# Patient Record
Sex: Female | Born: 1970 | Race: White | Hispanic: No | Marital: Married | Smoking: Former smoker
Health system: Southern US, Community
[De-identification: ages and names within clinical notes are randomized; demographics above are authoritative.]

## PROBLEM LIST (undated history)

## (undated) DIAGNOSIS — M1990 Unspecified osteoarthritis, unspecified site: Secondary | ICD-10-CM

## (undated) HISTORY — PX: DILITATION & CURRETTAGE/HYSTROSCOPY WITH ESSURE: SHX5573

## (undated) HISTORY — PX: OVARIAN CYST REMOVAL: SHX89

## (undated) HISTORY — DX: Unspecified osteoarthritis, unspecified site: M19.90

## (undated) HISTORY — PX: BREAST CYST ASPIRATION: SHX578

## (undated) HISTORY — PX: REFRACTIVE SURGERY: SHX103

## (undated) HISTORY — PX: ECTOPIC PREGNANCY SURGERY: SHX613

---

## 2003-02-13 ENCOUNTER — Ambulatory Visit (HOSPITAL_COMMUNITY): Admission: RE | Admit: 2003-02-13 | Discharge: 2003-02-13 | Payer: Self-pay | Admitting: *Deleted

## 2005-05-16 ENCOUNTER — Inpatient Hospital Stay: Payer: Self-pay | Admitting: Obstetrics and Gynecology

## 2008-04-28 ENCOUNTER — Ambulatory Visit: Payer: Self-pay | Admitting: Obstetrics and Gynecology

## 2009-02-28 ENCOUNTER — Emergency Department: Payer: Self-pay | Admitting: Emergency Medicine

## 2009-11-20 HISTORY — PX: BREAST BIOPSY: SHX20

## 2012-07-16 ENCOUNTER — Ambulatory Visit: Payer: Self-pay | Admitting: Obstetrics and Gynecology

## 2013-07-21 LAB — HM MAMMOGRAPHY: HM MAMMO: NORMAL

## 2013-07-23 ENCOUNTER — Ambulatory Visit: Payer: Self-pay | Admitting: Obstetrics and Gynecology

## 2014-10-01 ENCOUNTER — Ambulatory Visit: Payer: Self-pay | Admitting: Internal Medicine

## 2015-05-27 ENCOUNTER — Encounter: Payer: Self-pay | Admitting: Internal Medicine

## 2015-05-27 DIAGNOSIS — Z9889 Other specified postprocedural states: Secondary | ICD-10-CM | POA: Insufficient documentation

## 2015-05-27 DIAGNOSIS — E010 Iodine-deficiency related diffuse (endemic) goiter: Secondary | ICD-10-CM | POA: Insufficient documentation

## 2015-05-27 DIAGNOSIS — K5909 Other constipation: Secondary | ICD-10-CM | POA: Insufficient documentation

## 2015-05-27 DIAGNOSIS — F411 Generalized anxiety disorder: Secondary | ICD-10-CM | POA: Insufficient documentation

## 2015-05-27 DIAGNOSIS — G4700 Insomnia, unspecified: Secondary | ICD-10-CM | POA: Insufficient documentation

## 2015-05-27 DIAGNOSIS — M5417 Radiculopathy, lumbosacral region: Secondary | ICD-10-CM | POA: Insufficient documentation

## 2015-05-28 ENCOUNTER — Encounter: Payer: Self-pay | Admitting: Internal Medicine

## 2015-05-28 ENCOUNTER — Ambulatory Visit (INDEPENDENT_AMBULATORY_CARE_PROVIDER_SITE_OTHER): Payer: BC Managed Care – PPO | Admitting: Internal Medicine

## 2015-05-28 VITALS — BP 122/76 | HR 100 | Ht 68.0 in | Wt 191.2 lb

## 2015-05-28 DIAGNOSIS — N6320 Unspecified lump in the left breast, unspecified quadrant: Secondary | ICD-10-CM

## 2015-05-28 DIAGNOSIS — N63 Unspecified lump in breast: Secondary | ICD-10-CM | POA: Diagnosis not present

## 2015-05-28 DIAGNOSIS — F410 Panic disorder [episodic paroxysmal anxiety] without agoraphobia: Secondary | ICD-10-CM | POA: Insufficient documentation

## 2015-05-28 NOTE — Progress Notes (Signed)
Date:  05/28/2015   Name:  Lauren Cooke   DOB:  04/21/1971   MRN:  [id number]   Chief Complaint: Breast Mass HPI - Noticed a lump in the other left breast about one week ago.  She has had several breast cysts in the past that have had FNA.  All have been benign.  There is no family hx of breast cancer but her mother has fibrocystic breast disease.  She denies redness, breast pain or nipple discharge.  She has been consuming caffeine on a regular basis.  Her last menses was 2 weeks ago.   Review of Systems:  Review of Systems  Constitutional: Negative for fever.  Respiratory: Negative for shortness of breath.   Cardiovascular: Negative for chest pain.  Skin: Negative for color change and rash.  Hematological: Negative for adenopathy. Does not bruise/bleed easily.    Patient Active Problem List   Diagnosis Date Noted  . Episodic paroxysmal anxiety disorder 05/28/2015  . Chronic constipation 05/27/2015  . Anxiety, generalized 05/27/2015  . History of biliary T-tube placement 05/27/2015  . 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 L-S radiculopathy 05/27/2015  . Big thyroid 05/27/2015    Prior to Admission medications   Medication Sig Start Date End Date Taking? Authorizing Provider  PARoxetine (PAXIL-CR) 25 MG 24 hr tablet Take 2 tablets by mouth daily at 2 PM daily at 2 PM.   Yes Historical Provider, MD  zolpidem (AMBIEN) 10 MG tablet Take 1 tablet by mouth at bedtime.   Yes Historical Provider, MD    Allergies  Allergen Reactions  . Clindamycin Other (See Comments) and Anaphylaxis  . Cefuroxime Swelling  . Erythromycin Rash  . Penicillins Rash    Past Surgical History  Procedure Laterality Date  . Breast biopsy  2011    benign  . Dilitation & currettage/hystroscopy with essure    . Refractive surgery    . Ectopic pregnancy surgery    . Ovarian cyst removal      History  Substance Use Topics  . Smoking status: Former Smoker    Quit date:  11/20/1996  . Smokeless tobacco: Not on file  . Alcohol Use: 0.0 oz/week    0 Standard drinks or equivalent per week     Medication list has been reviewed and updated.  Physical Examination:  Physical Exam  Constitutional: She appears well-developed and well-nourished. No distress.  Cardiovascular: Normal rate and regular rhythm.   Pulmonary/Chest: Breath sounds normal. Right breast exhibits no mass, no nipple discharge, no skin change and no tenderness. Left breast exhibits mass. Left breast exhibits no nipple discharge, no skin change and no tenderness.    Lymphadenopathy:    She has no cervical adenopathy.    She has no axillary adenopathy.  Skin: Skin is warm, dry and intact.    BP 122/76 mmHg  Pulse 100  Ht 5' 8"  (1.727 m)  Wt 191 lb 3.2 oz (86.728 kg)  BMI 29.08 kg/m2  Assessment and Plan: 1. Breast mass, left Refer for evaluation Reduce caffeine intake - MM Digital Diagnostic Unilat L; Future - US BREAST COMPLETE UNI LEFT INC AXILLA; Future   Halina Maidens, MD [facility]  05/28/2015

## 2015-06-02 ENCOUNTER — Ambulatory Visit
Admission: RE | Admit: 2015-06-02 | Discharge: 2015-06-02 | Disposition: A | Discharge status: Home / Self Care | Payer: BC Managed Care – PPO | Source: Ambulatory Visit | Attending: Internal Medicine | Admitting: Internal Medicine

## 2015-06-02 ENCOUNTER — Other Ambulatory Visit: Payer: Self-pay | Admitting: Internal Medicine

## 2015-06-02 DIAGNOSIS — N6320 Unspecified lump in the left breast, unspecified quadrant: Secondary | ICD-10-CM

## 2015-06-02 DIAGNOSIS — N63 Unspecified lump in breast: Secondary | ICD-10-CM | POA: Insufficient documentation

## 2015-12-13 ENCOUNTER — Other Ambulatory Visit: Payer: Self-pay | Admitting: Internal Medicine

## 2015-12-13 DIAGNOSIS — Z1231 Encounter for screening mammogram for malignant neoplasm of breast: Secondary | ICD-10-CM

## 2015-12-15 ENCOUNTER — Ambulatory Visit
Admission: RE | Admit: 2015-12-15 | Discharge: 2015-12-15 | Disposition: A | Discharge status: Home / Self Care | Payer: BC Managed Care – PPO | Source: Ambulatory Visit | Attending: Internal Medicine | Admitting: Internal Medicine

## 2015-12-15 DIAGNOSIS — Z1231 Encounter for screening mammogram for malignant neoplasm of breast: Secondary | ICD-10-CM | POA: Diagnosis not present

## 2016-02-21 IMAGING — US US BREAST LTD UNI LEFT INC AXILLA
1 series · 1 of 1 positions shown · non-contrast
Comparison: Previous exam(s).

CLINICAL DATA: Patient for evaluation of left breast palpable
abnormality.

EXAM:
DIGITAL DIAGNOSTIC LEFT MAMMOGRAM WITH CAD
ULTRASOUND LEFT BREAST

[Series 1: us breast ltd uni left inc axilla · 0.08mm/px · 1 of 1 slices shown]
[im 1/1]
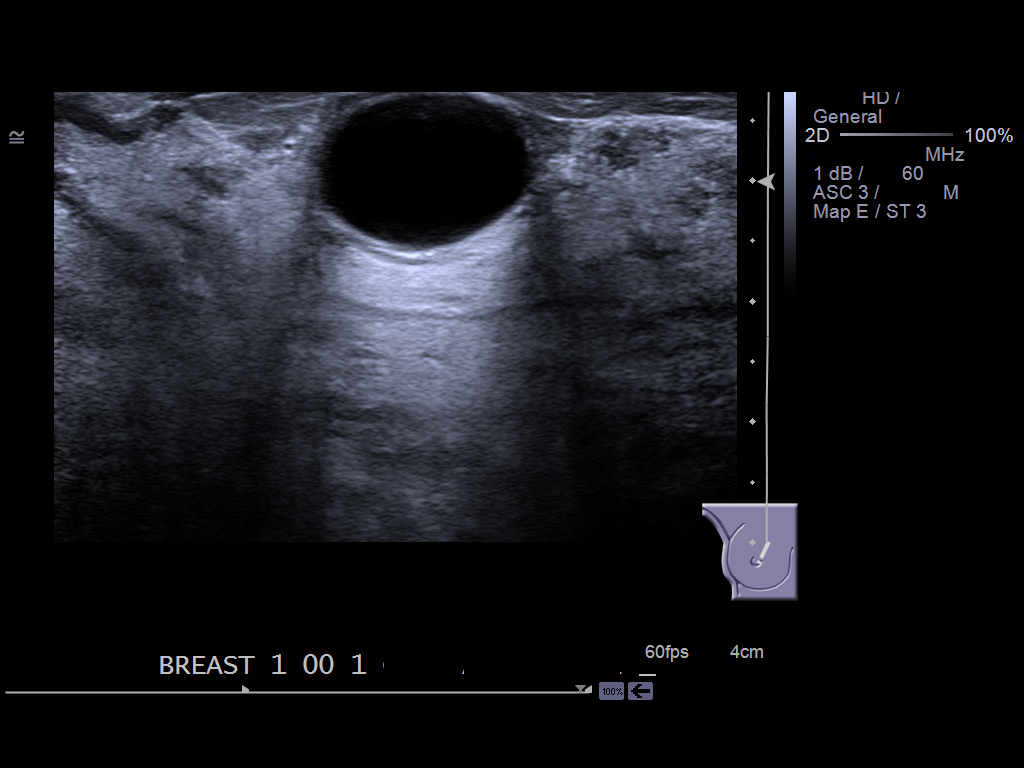

[1 of 1 positions shown; findings below may reference images not displayed]

ACR Breast Density Category d: The breast tissue is extremely dense,
which lowers the sensitivity of mammography.
FINDINGS: Multiple oval circumscribed masses are demonstrated throughout the
left breast. Underlying the palpable abnormality within the
upper-outer left breast anterior depth is an obscured oval mass. No
suspicious areas of architectural distortion identified.

Mammographic images were processed with CAD.

On physical exam, I palpate a soft mobile mass within the
upper-outer left breast.

Targeted ultrasound is performed, showing a 2.0 x 1.4 x 1.8 cm
simple cyst within the left breast 1 o'clock position 1 cm from the
nipple. Multiple adjacent simple cysts are demonstrated.
IMPRESSION: Palpable abnormality corresponds with a benign simple cyst.

RECOMMENDATION:
Return to annual screening mammography [DATE].

I have discussed the findings and recommendations with the patient.
Results were also provided in writing at the conclusion of the
visit. If applicable, a reminder letter will be sent to the patient
regarding the next appointment.

BI-RADS CATEGORY  2: Benign.

## 2016-02-24 ENCOUNTER — Ambulatory Visit (INDEPENDENT_AMBULATORY_CARE_PROVIDER_SITE_OTHER): Payer: BC Managed Care – PPO | Admitting: Internal Medicine

## 2016-02-24 ENCOUNTER — Other Ambulatory Visit: Payer: Self-pay | Admitting: Internal Medicine

## 2016-02-24 ENCOUNTER — Encounter: Payer: Self-pay | Admitting: Internal Medicine

## 2016-02-24 VITALS — BP 112/70 | HR 68 | Ht 68.0 in | Wt 197.2 lb

## 2016-02-24 DIAGNOSIS — F411 Generalized anxiety disorder: Secondary | ICD-10-CM

## 2016-02-24 DIAGNOSIS — G4700 Insomnia, unspecified: Secondary | ICD-10-CM

## 2016-02-24 DIAGNOSIS — M5417 Radiculopathy, lumbosacral region: Secondary | ICD-10-CM | POA: Diagnosis not present

## 2016-02-24 MED ORDER — CYCLOBENZAPRINE HCL 10 MG PO TABS: 10.0000 mg | ORAL_TABLET | Freq: Three times a day (TID) | ORAL | Status: DC | PRN

## 2016-02-24 NOTE — Patient Instructions (Signed)
Breast Self-Awareness Practicing breast self-awareness may pick up problems early, prevent significant medical complications, and possibly save your life. By practicing breast self-awareness, you can become familiar with how your breasts look and feel and if your breasts are changing. This allows you to notice changes early. It can also offer you some reassurance that your breast health is good. One way to learn what is normal for your breasts and whether your breasts are changing is to do a breast self-exam. If you find a lump or something that was not present in the past, it is best to contact your caregiver right away. Other findings that should be evaluated by your caregiver include nipple discharge, especially if it is bloody; skin changes or reddening; areas where the skin seems to be pulled in (retracted); or new lumps and bumps. Breast pain is seldom associated with cancer (malignancy), but should also be evaluated by a caregiver. HOW TO PERFORM A BREAST SELF-EXAM The best time to examine your breasts is 5-7 days after your menstrual period is over. During menstruation, the breasts are lumpier, and it may be more difficult to pick up changes. If you do not menstruate, have reached menopause, or had your uterus removed (hysterectomy), you should examine your breasts at regular intervals, such as monthly. If you are breastfeeding, examine your breasts after a feeding or after using a breast pump. Breast implants do not decrease the risk for lumps or tumors, so continue to perform breast self-exams as recommended. Talk to your caregiver about how to determine the difference between the implant and breast tissue. Also, talk about the amount of pressure you should use during the exam. Over time, you will become more familiar with the variations of your breasts and more comfortable with the exam. A breast self-exam requires you to remove all your clothes above the waist. 1. Look at your breasts and nipples.  Stand in front of a mirror in a room with good lighting. With your hands on your hips, push your hands firmly downward. Look for a difference in shape, contour, and size from one breast to the other (asymmetry). Asymmetry includes puckers, dips, or bumps. Also, look for skin changes, such as reddened or scaly areas on the breasts. Look for nipple changes, such as discharge, dimpling, repositioning, or redness. 2. Carefully feel your breasts. This is best done either in the shower or tub while using soapy water or when flat on your back. Place the arm (on the side of the breast you are examining) above your head. Use the pads (not the fingertips) of your three middle fingers on your opposite hand to feel your breasts. Start in the underarm area and use  inch (2 cm) overlapping circles to feel your breast. Use 3 different levels of pressure (light, medium, and firm pressure) at each circle before moving to the next circle. The light pressure is needed to feel the tissue closest to the skin. The medium pressure will help to feel breast tissue a little deeper, while the firm pressure is needed to feel the tissue close to the ribs. Continue the overlapping circles, moving downward over the breast until you feel your ribs below your breast. Then, move one finger-width towards the center of the body. Continue to use the  inch (2 cm) overlapping circles to feel your breast as you move slowly up toward the collar bone (clavicle) near the base of the neck. Continue the up and down exam using all 3 pressures until you reach the  middle of the chest. Do this with each breast, carefully feeling for lumps or changes. 3.  Keep a written record with breast changes or normal findings for each breast. By writing this information down, you do not need to depend only on memory for size, tenderness, or location. Write down where you are in your menstrual cycle, if you are still menstruating. Breast tissue can have some lumps or  thick tissue. However, see your caregiver if you find anything that concerns you.  SEEK MEDICAL CARE IF:  You see a change in shape, contour, or size of your breasts or nipples.   You see skin changes, such as reddened or scaly areas on the breasts or nipples.   You have an unusual discharge from your nipples.   You feel a new lump or unusually thick areas.    This information is not intended to replace advice given to you by your health care provider. Make sure you discuss any questions you have with your health care provider.   Document Released: 11/06/2005 Document Revised: 10/23/2012 Document Reviewed: 02/21/2012 Elsevier Interactive Patient Education [employer].

## 2016-02-24 NOTE — Progress Notes (Signed)
Date:  02/24/2016   Name:  Lauren Cooke   DOB:  [birth year]   MRN:  [id number]   Chief Complaint: Back Pain Back Pain This is a recurrent problem. The current episode started more than 1 year ago. The problem occurs every several days. The pain is present in the lumbar spine. The quality of the pain is described as aching and cramping. The pain is mild. Pertinent negatives include no chest pain, numbness or weakness. She has tried muscle relaxant and NSAIDs for the symptoms.  She has been evaluated about 2 years ago by Dr. Velna Ochs in [facility].  Her MRI showed small disc bulge at 2 levels in the lumbar spine. She gets back spasm and discomfort after excessive activity such as yard work.  She would like to take flexeril 1-2 times per week.  She is running out of medication.   Review of Systems  Constitutional: Negative for chills and fatigue.  Respiratory: Negative for choking and shortness of breath.   Cardiovascular: Negative for chest pain, palpitations and leg swelling.  Gastrointestinal:       No loss of bowel control  Genitourinary: Negative for difficulty urinating.  Musculoskeletal: Positive for myalgias and back pain. Negative for joint swelling and gait problem.  Neurological: Negative for weakness and numbness.    Patient Active Problem List   Diagnosis Date Noted  . Episodic paroxysmal anxiety disorder 05/28/2015  . Chronic constipation 05/27/2015  . Anxiety, generalized 05/27/2015  . History of biliary T-tube placement 05/27/2015  . 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 L-S radiculopathy 05/27/2015  . Big thyroid 05/27/2015    Prior to Admission medications   Medication Sig Start Date End Date Taking? Authorizing Provider  sertraline (ZOLOFT) 50 MG tablet Take 1 tablet by mouth daily. 02/15/16  Yes Historical Provider, MD  zolpidem (AMBIEN) 10 MG tablet Take 1 tablet by mouth at bedtime.   Yes Historical Provider, MD    Allergies  Allergen  Reactions  . Clindamycin Other (See Comments) and Anaphylaxis  . Cefuroxime Swelling  . Erythromycin Rash  . Penicillins Rash    Past Surgical History  Procedure Laterality Date  . Breast biopsy  2011    benign  . Dilitation & currettage/hystroscopy with essure    . Refractive surgery    . Ectopic pregnancy surgery    . Ovarian cyst removal    . Breast cyst aspiration Right     negative 2011    Social History  Substance Use Topics  . Smoking status: Former Smoker    Quit date: 11/20/1996  . Smokeless tobacco: None  . Alcohol Use: 0.0 oz/week    0 Standard drinks or equivalent per week     Comment: occasional     Medication list has been reviewed and updated.   Physical Exam  Constitutional: She appears well-developed and well-nourished.  Neck: Normal range of motion.  Cardiovascular: Normal rate, regular rhythm and normal heart sounds.   Pulmonary/Chest: Effort normal and breath sounds normal.  Musculoskeletal:       Right hip: Normal. She exhibits normal range of motion.       Left hip: Normal.       Lumbar back: She exhibits tenderness and spasm. She exhibits no edema and no pain.  Normal SLR, normal reflexes  Neurological: She has normal reflexes.  Nursing note and vitals reviewed.   BP 112/70 mmHg  Pulse 68  Ht 5' 8"  (1.727 m)  Wt 197  lb 3.2 oz (89.449 kg)  BMI 29.99 kg/m2  LMP 02/22/2016  Assessment and Plan: 1. L-S radiculopathy Use flexeril PRN along with Aleve - cyclobenzaprine (FLEXERIL) 10 MG tablet; Take 1 tablet (10 mg total) by mouth 3 (three) times daily as needed for muscle spasms.  Dispense: 60 tablet; Refill: 1  2. Insomnia disorder related to known organic factor stable  3. Anxiety, generalized Doing well on Zoloft - followed by Dr. Melba Coon, MD [facility]  02/24/2016

## 2016-03-09 ENCOUNTER — Ambulatory Visit (INDEPENDENT_AMBULATORY_CARE_PROVIDER_SITE_OTHER): Payer: BC Managed Care – PPO | Admitting: Internal Medicine

## 2016-03-09 ENCOUNTER — Encounter: Payer: Self-pay | Admitting: Internal Medicine

## 2016-03-09 VITALS — BP 108/76 | HR 100 | Ht 68.0 in | Wt 195.0 lb

## 2016-03-09 DIAGNOSIS — E049 Nontoxic goiter, unspecified: Secondary | ICD-10-CM

## 2016-03-09 DIAGNOSIS — F411 Generalized anxiety disorder: Secondary | ICD-10-CM | POA: Diagnosis not present

## 2016-03-09 DIAGNOSIS — Z0000 Encounter for general adult medical examination without abnormal findings: Secondary | ICD-10-CM

## 2016-03-09 DIAGNOSIS — Z124 Encounter for screening for malignant neoplasm of cervix: Secondary | ICD-10-CM

## 2016-03-09 LAB — POCT URINALYSIS DIPSTICK
Bilirubin, UA: NEGATIVE
Blood, UA: NEGATIVE
Glucose, UA: NEGATIVE
KETONES UA: NEGATIVE
Leukocytes, UA: NEGATIVE
Nitrite, UA: NEGATIVE
Protein, UA: NEGATIVE
SPEC GRAV UA: 1.015
Urobilinogen, UA: 0.2
pH, UA: 5

## 2016-03-09 NOTE — Patient Instructions (Signed)
Breast Self-Awareness Practicing breast self-awareness may pick up problems early, prevent significant medical complications, and possibly save your life. By practicing breast self-awareness, you can become familiar with how your breasts look and feel and if your breasts are changing. This allows you to notice changes early. It can also offer you some reassurance that your breast health is good. One way to learn what is normal for your breasts and whether your breasts are changing is to do a breast self-exam. If you find a lump or something that was not present in the past, it is best to contact your caregiver right away. Other findings that should be evaluated by your caregiver include nipple discharge, especially if it is bloody; skin changes or reddening; areas where the skin seems to be pulled in (retracted); or new lumps and bumps. Breast pain is seldom associated with cancer (malignancy), but should also be evaluated by a caregiver. HOW TO PERFORM A BREAST SELF-EXAM The best time to examine your breasts is 5-7 days after your menstrual period is over. During menstruation, the breasts are lumpier, and it may be more difficult to pick up changes. If you do not menstruate, have reached menopause, or had your uterus removed (hysterectomy), you should examine your breasts at regular intervals, such as monthly. If you are breastfeeding, examine your breasts after a feeding or after using a breast pump. Breast implants do not decrease the risk for lumps or tumors, so continue to perform breast self-exams as recommended. Talk to your caregiver about how to determine the difference between the implant and breast tissue. Also, talk about the amount of pressure you should use during the exam. Over time, you will become more familiar with the variations of your breasts and more comfortable with the exam. A breast self-exam requires you to remove all your clothes above the waist. 1. Look at your breasts and nipples.  Stand in front of a mirror in a room with good lighting. With your hands on your hips, push your hands firmly downward. Look for a difference in shape, contour, and size from one breast to the other (asymmetry). Asymmetry includes puckers, dips, or bumps. Also, look for skin changes, such as reddened or scaly areas on the breasts. Look for nipple changes, such as discharge, dimpling, repositioning, or redness. 2. Carefully feel your breasts. This is best done either in the shower or tub while using soapy water or when flat on your back. Place the arm (on the side of the breast you are examining) above your head. Use the pads (not the fingertips) of your three middle fingers on your opposite hand to feel your breasts. Start in the underarm area and use  inch (2 cm) overlapping circles to feel your breast. Use 3 different levels of pressure (light, medium, and firm pressure) at each circle before moving to the next circle. The light pressure is needed to feel the tissue closest to the skin. The medium pressure will help to feel breast tissue a little deeper, while the firm pressure is needed to feel the tissue close to the ribs. Continue the overlapping circles, moving downward over the breast until you feel your ribs below your breast. Then, move one finger-width towards the center of the body. Continue to use the  inch (2 cm) overlapping circles to feel your breast as you move slowly up toward the collar bone (clavicle) near the base of the neck. Continue the up and down exam using all 3 pressures until you reach the  middle of the chest. Do this with each breast, carefully feeling for lumps or changes. 3.  Keep a written record with breast changes or normal findings for each breast. By writing this information down, you do not need to depend only on memory for size, tenderness, or location. Write down where you are in your menstrual cycle, if you are still menstruating. Breast tissue can have some lumps or  thick tissue. However, see your caregiver if you find anything that concerns you.  SEEK MEDICAL CARE IF:  You see a change in shape, contour, or size of your breasts or nipples.   You see skin changes, such as reddened or scaly areas on the breasts or nipples.   You have an unusual discharge from your nipples.   You feel a new lump or unusually thick areas.    This information is not intended to replace advice given to you by your health care provider. Make sure you discuss any questions you have with your health care provider.   Document Released: 11/06/2005 Document Revised: 10/23/2012 Document Reviewed: 02/21/2012 Elsevier Interactive Patient Education [employer].

## 2016-03-09 NOTE — Progress Notes (Signed)
Date:  03/09/2016   Name:  Lauren Cooke   DOB:  [birth year]   MRN:  [id number]   Chief Complaint: Annual Exam Lauren Cooke is a 45 y.o. female who presents today for her Complete Annual Exam. She feels fairly well. She reports exercising intermittently. She reports she is sleeping fairly well. Mammogram was done in January.  Insomnia Primary symptoms: no sleep disturbance.  The problem occurs every several days. The problem is unchanged. The symptoms are aggravated by anxiety. Past treatments include medication. The treatment provided significant relief. Prior workup: followed by Psychiatry - treated for anxiety and insomnia.     Review of Systems  Constitutional: Negative for fever, chills and fatigue.  HENT: Negative for congestion, hearing loss, tinnitus, trouble swallowing and voice change.   Eyes: Negative for visual disturbance.  Respiratory: Negative for cough, chest tightness, shortness of breath and wheezing.   Cardiovascular: Negative for chest pain, palpitations and leg swelling.  Gastrointestinal: Negative for vomiting, abdominal pain, diarrhea and constipation.  Endocrine: Negative for polydipsia and polyuria.  Genitourinary: Negative for dysuria, frequency, vaginal bleeding, vaginal discharge and genital sores.  Musculoskeletal: Positive for myalgias and back pain. Negative for joint swelling, arthralgias and gait problem.  Skin: Negative for color change and rash.  Neurological: Negative for dizziness, tremors, light-headedness and headaches.  Hematological: Negative for adenopathy. Does not bruise/bleed easily.  Psychiatric/Behavioral: Negative for sleep disturbance and dysphoric mood. The patient has insomnia. The patient is not nervous/anxious.     Patient Active Problem List   Diagnosis Date Noted  . Enlarged thyroid gland 03/09/2016  . Episodic paroxysmal anxiety disorder 05/28/2015  . Chronic constipation 05/27/2015  . Anxiety, generalized 05/27/2015    . 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 L-S radiculopathy 05/27/2015    Prior to Admission medications   Medication Sig Start Date End Date Taking? Authorizing Provider  cyclobenzaprine (FLEXERIL) 10 MG tablet Take 1 tablet (10 mg total) by mouth 3 (three) times daily as needed for muscle spasms. 02/24/16   Glean Hess, MD  sertraline (ZOLOFT) 50 MG tablet Take 1 tablet by mouth daily. 02/15/16   Historical Provider, MD  zolpidem (AMBIEN) 10 MG tablet Take 1 tablet by mouth at bedtime.    Historical Provider, MD    Allergies  Allergen Reactions  . Clindamycin Other (See Comments) and Anaphylaxis  . Cefuroxime Swelling  . Erythromycin Rash  . Penicillins Rash    Past Surgical History  Procedure Laterality Date  . Breast biopsy  2011    benign  . Dilitation & currettage/hystroscopy with essure    . Refractive surgery    . Ectopic pregnancy surgery    . Ovarian cyst removal    . Breast cyst aspiration Right     negative 2011    Social History  Substance Use Topics  . Smoking status: Former Smoker    Quit date: 11/20/1996  . Smokeless tobacco: None  . Alcohol Use: 0.0 oz/week    0 Standard drinks or equivalent per week     Comment: occasional     Medication list has been reviewed and updated.   Physical Exam  Constitutional: She is oriented to person, place, and time. She appears well-developed and well-nourished. No distress.  HENT:  Head: Normocephalic and atraumatic.  Right Ear: Tympanic membrane and ear canal normal.  Left Ear: Tympanic membrane and ear canal normal.  Nose: Right sinus exhibits no maxillary sinus tenderness. Left sinus exhibits no  maxillary sinus tenderness.  Mouth/Throat: Uvula is midline and oropharynx is clear and moist.  Eyes: Conjunctivae and EOM are normal. Right eye exhibits no discharge. Left eye exhibits no discharge. No scleral icterus.  Neck: Normal range of motion. Carotid bruit is not present. No  erythema present. Thyromegaly (modestly enlarged bilaterally - non tender) present.  Cardiovascular: Normal rate, regular rhythm, normal heart sounds and normal pulses.   Pulmonary/Chest: Effort normal. No respiratory distress. She has no wheezes. Right breast exhibits no mass, no nipple discharge, no skin change and no tenderness. Left breast exhibits no mass, no nipple discharge, no skin change and no tenderness.  Abdominal: Soft. Bowel sounds are normal. There is no hepatosplenomegaly. There is no tenderness. There is no CVA tenderness.  Genitourinary: Vagina normal and uterus normal. There is no rash, tenderness or lesion on the right labia. There is no rash, tenderness or lesion on the left labia. Cervix exhibits no motion tenderness, no discharge and no friability. Right adnexum displays no mass, no tenderness and no fullness. Left adnexum displays no tenderness and no fullness.  Musculoskeletal: Normal range of motion.  Lymphadenopathy:    She has no cervical adenopathy.    She has no axillary adenopathy.  Neurological: She is alert and oriented to person, place, and time. She has normal reflexes. No cranial nerve deficit or sensory deficit.  Skin: Skin is warm, dry and intact. No rash noted.  Psychiatric: She has a normal mood and affect. Her speech is normal and behavior is normal. Thought content normal.  Nursing note and vitals reviewed.   BP 108/76 mmHg  Pulse 100  Ht 5' 8"  (1.727 m)  Wt 195 lb (88.451 kg)  BMI 29.66 kg/m2  LMP 02/22/2016  Assessment and Plan: 1. Annual physical exam Recommend resuming regular exercise - CBC with Differential/Platelet - Comprehensive metabolic panel - Lipid panel - POCT urinalysis dipstick  2. Encounter for screening for cervical cancer  Normal exam - can repeat in 3-5 yrs if Pap/HPV normal - Pap IG and HPV (high risk) DNA detection  3. Anxiety, generalized Followed by Psychiatry  On Zoloft  4. Enlarged thyroid gland Stable without  symptoms - TSH   Lauren Maidens, MD [facility]  03/09/2016

## 2016-03-10 ENCOUNTER — Telehealth: Payer: Self-pay

## 2016-03-10 LAB — COMPREHENSIVE METABOLIC PANEL
ALBUMIN: 4.4 g/dL (ref 3.5–5.5)
ALT: 18 IU/L (ref 0–32)
AST: 13 IU/L (ref 0–40)
Albumin/Globulin Ratio: 1.5 (ref 1.2–2.2)
Alkaline Phosphatase: 87 IU/L (ref 39–117)
BUN / CREAT RATIO: 17 (ref 9–23)
BUN: 13 mg/dL (ref 6–24)
Bilirubin Total: 0.3 mg/dL (ref 0.0–1.2)
CO2: 22 mmol/L (ref 18–29)
Calcium: 9.7 mg/dL (ref 8.7–10.2)
Chloride: 99 mmol/L (ref 96–106)
Creatinine, Ser: 0.77 mg/dL (ref 0.57–1.00)
GFR, EST AFRICAN AMERICAN: 109 mL/min/{1.73_m2} (ref >59)
GFR, EST NON AFRICAN AMERICAN: 94 mL/min/{1.73_m2} (ref >59)
GLOBULIN, TOTAL: 2.9 g/dL (ref 1.5–4.5)
Glucose: 114 mg/dL — ABNORMAL HIGH (ref 65–99)
Potassium: 4.1 mmol/L (ref 3.5–5.2)
Sodium: 139 mmol/L (ref 134–144)
TOTAL PROTEIN: 7.3 g/dL (ref 6.0–8.5)

## 2016-03-10 LAB — CBC WITH DIFFERENTIAL/PLATELET
BASOS ABS: 0 10*3/uL (ref 0.0–0.2)
Basos: 0 %
EOS (ABSOLUTE): 0.1 10*3/uL (ref 0.0–0.4)
EOS: 1 %
HEMATOCRIT: 37.6 % (ref 34.0–46.6)
HEMOGLOBIN: 12.4 g/dL (ref 11.1–15.9)
IMMATURE GRANS (ABS): 0 10*3/uL (ref 0.0–0.1)
Immature Granulocytes: 0 %
LYMPHS: 24 %
Lymphocytes Absolute: 1.8 10*3/uL (ref 0.7–3.1)
MCH: 29 pg (ref 26.6–33.0)
MCHC: 33 g/dL (ref 31.5–35.7)
MCV: 88 fL (ref 79–97)
MONOCYTES: 7 %
Monocytes Absolute: 0.5 10*3/uL (ref 0.1–0.9)
Neutrophils Absolute: 4.9 10*3/uL (ref 1.4–7.0)
Neutrophils: 68 %
Platelets: 280 10*3/uL (ref 150–379)
RBC: 4.27 x10E6/uL (ref 3.77–5.28)
RDW: 13.9 % (ref 12.3–15.4)
WBC: 7.4 10*3/uL (ref 3.4–10.8)

## 2016-03-10 LAB — TSH: TSH: 1.27 u[IU]/mL (ref 0.450–4.500)

## 2016-03-10 LAB — LIPID PANEL
CHOLESTEROL TOTAL: 211 mg/dL — AB (ref 100–199)
Chol/HDL Ratio: 3.2 ratio units (ref 0.0–4.4)
HDL: 65 mg/dL (ref >39)
LDL Calculated: 127 mg/dL — ABNORMAL HIGH (ref 0–99)
Triglycerides: 97 mg/dL (ref 0–149)
VLDL CHOLESTEROL CAL: 19 mg/dL (ref 5–40)

## 2016-03-10 NOTE — Telephone Encounter (Signed)
-----   Message from Glean Hess, MD sent at 03/10/2016  8:28 AM EDT ----- Labs all normal.  Blood sugar slightly elevated since not fasting.

## 2016-03-10 NOTE — Telephone Encounter (Signed)
Spoke with patient. Patient advised of all results and verbalized understanding. Will call back with any future questions or concerns. [facility]

## 2016-03-14 ENCOUNTER — Telehealth: Payer: Self-pay

## 2016-03-14 LAB — PAP IG AND HPV HIGH-RISK
HPV, HIGH-RISK: NEGATIVE
PAP Smear Comment: 0

## 2016-03-14 NOTE — Telephone Encounter (Signed)
Spoke with patient. Patient advised of all results and verbalized understanding. Will call back with any future questions or concerns. [facility]

## 2016-03-14 NOTE — Telephone Encounter (Signed)
-----   Message from Glean Hess, MD sent at 03/14/2016  2:29 PM EDT ----- Pap is negative with negative HPV.

## 2016-05-15 ENCOUNTER — Encounter: Payer: Self-pay | Admitting: Internal Medicine

## 2016-05-15 ENCOUNTER — Ambulatory Visit (INDEPENDENT_AMBULATORY_CARE_PROVIDER_SITE_OTHER): Payer: BC Managed Care – PPO | Admitting: Internal Medicine

## 2016-05-15 VITALS — BP 116/76 | HR 78 | Temp 98.7°F | Resp 16 | Ht 68.0 in | Wt 194.5 lb

## 2016-05-15 DIAGNOSIS — N61 Mastitis without abscess: Secondary | ICD-10-CM | POA: Diagnosis not present

## 2016-05-15 MED ORDER — LEVOFLOXACIN 500 MG PO TABS: 500.0000 mg | ORAL_TABLET | Freq: Every day | ORAL | Status: DC

## 2016-05-15 NOTE — Patient Instructions (Signed)
Continue Tylenol every 4-6 hours - might alternate with Ibuprofen 600 mg (ie Tylenol 2 tabs the ibuprofen 600 mg three hours later, then tylenol 3 hours later, etc.)  Warm or cool compresses if helpful

## 2016-05-15 NOTE — Progress Notes (Signed)
Date:  05/15/2016   Name:  Lauren Cooke   DOB:  11-26-1970   MRN:  [id number]   Chief Complaint: Breast Problem Patient reports started to have tenderness in her left breast 3 days ago. She recalls no injury. Over the next few days has become more tender swollen red and she is running a low-grade fever. She denies any nipple discharge.   Review of Systems  Constitutional: Positive for fever and chills.  Respiratory: Negative for cough, chest tightness and shortness of breath.   Cardiovascular: Negative for chest pain.  Skin: Positive for color change.    Patient Active Problem List   Diagnosis Date Noted  . Enlarged thyroid gland 03/09/2016  . Chronic constipation 05/27/2015  . Anxiety, generalized 05/27/2015  . 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 L-S radiculopathy 05/27/2015    Prior to Admission medications   Medication Sig Start Date End Date Taking? Authorizing Provider  clonazePAM (KLONOPIN) 0.5 MG tablet  05/09/16  Yes Historical Provider, MD  sertraline (ZOLOFT) 100 MG tablet 150 mg.  05/09/16  Yes Historical Provider, MD  zolpidem (AMBIEN) 10 MG tablet Take 1 tablet by mouth at bedtime.   Yes Historical Provider, MD  levofloxacin (LEVAQUIN) 500 MG tablet Take 1 tablet (500 mg total) by mouth daily. 05/15/16   Glean Hess, MD    Allergies  Allergen Reactions  . Clindamycin Other (See Comments) and Anaphylaxis  . Cefuroxime Swelling  . Erythromycin Rash  . Penicillins Rash    Past Surgical History  Procedure Laterality Date  . Breast biopsy  2011    benign  . Dilitation & currettage/hystroscopy with essure    . Refractive surgery    . Ectopic pregnancy surgery    . Ovarian cyst removal    . Breast cyst aspiration Right     negative 2011    Social History  Substance Use Topics  . Smoking status: Former Smoker    Quit date: 11/20/1996  . Smokeless tobacco: None  . Alcohol Use: 0.0 oz/week    0 Standard drinks or  equivalent per week     Comment: occasional     Medication list has been reviewed and updated.   Physical Exam  Constitutional: She is oriented to person, place, and time. She appears well-developed. No distress.  HENT:  Head: Normocephalic and atraumatic.  Cardiovascular: Normal rate and normal heart sounds.   Pulmonary/Chest: Effort normal and breath sounds normal. No respiratory distress.    Musculoskeletal: Normal range of motion.  Lymphadenopathy:    She has no cervical adenopathy.    She has no axillary adenopathy.  Neurological: She is alert and oriented to person, place, and time.  Skin: Skin is warm and dry. No rash noted.  Psychiatric: She has a normal mood and affect. Her behavior is normal. Thought content normal.  Nursing note and vitals reviewed.   BP 116/76 mmHg  Pulse 78  Temp(Src) 98.7 F (37.1 C)  Resp 16  Ht 5' 8"  (1.727 m)  Wt 194 lb 8 oz (88.225 kg)  BMI 29.58 kg/m2  SpO2 98%  LMP 05/03/2016  Assessment and Plan: 1. Mastitis in female Allergy to PCN, Ceph and Erythromycin Will try Levofloxacin and have close follow up Tylenol alt with Ibuprofen - levofloxacin (LEVAQUIN) 500 MG tablet; Take 1 tablet (500 mg total) by mouth daily.  Dispense: 10 tablet; Refill: 0   Halina Maidens, MD [facility]  05/15/2016

## 2016-05-18 ENCOUNTER — Ambulatory Visit (INDEPENDENT_AMBULATORY_CARE_PROVIDER_SITE_OTHER): Payer: BC Managed Care – PPO | Admitting: Internal Medicine

## 2016-05-18 ENCOUNTER — Encounter: Payer: Self-pay | Admitting: Internal Medicine

## 2016-05-18 VITALS — BP 128/78 | HR 97 | Resp 16 | Ht 68.0 in | Wt 199.0 lb

## 2016-05-18 DIAGNOSIS — N61 Mastitis without abscess: Secondary | ICD-10-CM | POA: Diagnosis not present

## 2016-05-18 NOTE — Progress Notes (Signed)
    Date:  05/18/2016   Name:  Lauren Cooke   DOB:  09-06-1971   MRN:  [id number]   Chief Complaint: mastitis Pt here to follow up mastitis.  Seen 3 days ago and placed on antibiotics.  She feels better and is tolerating the antibiotics.  She is having some itching on her nipple but no discharge.  No fevers or chills.    Review of Systems  Constitutional: Negative for fever, chills and fatigue.  Gastrointestinal: Negative for nausea and diarrhea.  Skin: Positive for color change (breast improving). Negative for rash.    Patient Active Problem List   Diagnosis Date Noted  . Enlarged thyroid gland 03/09/2016  . Chronic constipation 05/27/2015  . Anxiety, generalized 05/27/2015  . 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 L-S radiculopathy 05/27/2015    Prior to Admission medications   Medication Sig Start Date End Date Taking? Authorizing Provider  clonazePAM (KLONOPIN) 0.5 MG tablet  05/09/16  Yes Historical Provider, MD  levofloxacin (LEVAQUIN) 500 MG tablet Take 1 tablet (500 mg total) by mouth daily. 05/15/16  Yes Glean Hess, MD  sertraline (ZOLOFT) 100 MG tablet 150 mg.  05/09/16  Yes Historical Provider, MD  zolpidem (AMBIEN) 10 MG tablet Take 1 tablet by mouth at bedtime.   Yes Historical Provider, MD    Allergies  Allergen Reactions  . Clindamycin Other (See Comments) and Anaphylaxis  . Cefuroxime Swelling  . Erythromycin Rash  . Penicillins Rash    Past Surgical History  Procedure Laterality Date  . Breast biopsy  2011    benign  . Dilitation & currettage/hystroscopy with essure    . Refractive surgery    . Ectopic pregnancy surgery    . Ovarian cyst removal    . Breast cyst aspiration Right     negative 2011    Social History  Substance Use Topics  . Smoking status: Former Smoker    Quit date: 11/20/1996  . Smokeless tobacco: None  . Alcohol Use: 0.0 oz/week    0 Standard drinks or equivalent per week     Comment:  occasional     Medication list has been reviewed and updated.   Physical Exam  Constitutional: She is oriented to person, place, and time. She appears well-developed. No distress.  HENT:  Head: Normocephalic and atraumatic.  Pulmonary/Chest: Effort normal. No respiratory distress.    Breast much softer, less tender No nipple discharge  Musculoskeletal: Normal range of motion.  Neurological: She is alert and oriented to person, place, and time.  Skin: Skin is warm and dry. No rash noted.  Psychiatric: She has a normal mood and affect. Her behavior is normal. Thought content normal.  Nursing note and vitals reviewed.   BP 128/78 mmHg  Pulse 97  Resp 16  Ht 5' 8"  (1.727 m)  Wt 199 lb (90.266 kg)  BMI 30.26 kg/m2  SpO2 97%  LMP 05/03/2016  Assessment and Plan: 1. Mastitis in female Improving Finish course of antibiotics Follow up if needed   Halina Maidens, MD [facility]  05/18/2016

## 2016-05-24 ENCOUNTER — Encounter: Payer: BC Managed Care – PPO | Admitting: Internal Medicine

## 2016-06-09 ENCOUNTER — Telehealth: Payer: Self-pay

## 2016-06-09 NOTE — Telephone Encounter (Signed)
Treated for breast infection, took all antibiotic and still has hard knot in left breast, you felt, patient is out of town on vacation next week, wants to know if needs mammogram. Discussed with Dr. Army Melia, stated patient needs appointment to assess location and discuss mammogram. Patient informed and is scheduling for after her vacation.

## 2016-06-27 ENCOUNTER — Ambulatory Visit (INDEPENDENT_AMBULATORY_CARE_PROVIDER_SITE_OTHER): Payer: BC Managed Care – PPO | Admitting: Internal Medicine

## 2016-06-27 ENCOUNTER — Encounter: Payer: Self-pay | Admitting: Internal Medicine

## 2016-06-27 VITALS — BP 112/80 | HR 78 | Resp 16 | Ht 68.0 in | Wt 197.2 lb

## 2016-06-27 DIAGNOSIS — N6325 Unspecified lump in the left breast, overlapping quadrants: Secondary | ICD-10-CM

## 2016-06-27 DIAGNOSIS — N63 Unspecified lump in breast: Secondary | ICD-10-CM

## 2016-06-27 DIAGNOSIS — N6320 Unspecified lump in the left breast, unspecified quadrant: Principal | ICD-10-CM

## 2016-06-27 NOTE — Progress Notes (Signed)
    Date:  06/27/2016   Name:  Lauren Cooke   DOB:  25-Aug-1971   MRN:  [id number]   Chief Complaint: Breast Problem (Had mastitis at the end of June that cleared up with Abx and now has a LUMP on L breast that has moved or went down some. The lump is not painful at all. ) Patient was treated for mastitis of the left breast in June of this year. The infection and pain completely resolved but she has a residual lump that is not improving. She denies skin change or nipple discharge. She feels well. There is no family history of breast cancer.  Review of Systems  All other systems reviewed and are negative.   Patient Active Problem List   Diagnosis Date Noted  . Enlarged thyroid gland 03/09/2016  . Chronic constipation 05/27/2015  . Anxiety, generalized 05/27/2015  . 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 L-S radiculopathy 05/27/2015    Prior to Admission medications   Medication Sig Start Date End Date Taking? Authorizing Provider  clonazePAM (KLONOPIN) 0.5 MG tablet  05/09/16  Yes Historical Provider, MD  sertraline (ZOLOFT) 100 MG tablet 150 mg.  05/09/16  Yes Historical Provider, MD  zolpidem (AMBIEN) 10 MG tablet Take 1 tablet by mouth at bedtime.   Yes Historical Provider, MD  levofloxacin (LEVAQUIN) 500 MG tablet Take 1 tablet (500 mg total) by mouth daily. Patient not taking: Reported on 06/27/2016 05/15/16   Glean Hess, MD    Allergies  Allergen Reactions  . Clindamycin Other (See Comments) and Anaphylaxis  . Cefuroxime Swelling  . Erythromycin Rash  . Penicillins Rash    Past Surgical History:  Procedure Laterality Date  . BREAST BIOPSY  2011   benign  . BREAST CYST ASPIRATION Right    negative 2011  . DILITATION & CURRETTAGE/HYSTROSCOPY WITH ESSURE    . ECTOPIC PREGNANCY SURGERY    . OVARIAN CYST REMOVAL    . REFRACTIVE SURGERY      Social History  Substance Use Topics  . Smoking status: Former Smoker    Quit date: 11/20/1996  .  Smokeless tobacco: Former Systems developer  . Alcohol use 0.0 oz/week     Comment: occasional     Medication list has been reviewed and updated.   Physical Exam  Constitutional: She is oriented to person, place, and time. She appears well-developed. No distress.  HENT:  Head: Normocephalic and atraumatic.  Pulmonary/Chest: Effort normal. No respiratory distress.    Firm non tender mass at 3 oclock  Musculoskeletal: Normal range of motion.  Neurological: She is alert and oriented to person, place, and time.  Skin: Skin is warm and dry. No rash noted.  Psychiatric: She has a normal mood and affect. Her behavior is normal. Thought content normal.    BP 112/80 (BP Location: Right Arm, Patient Position: Sitting, Cuff Size: Normal)   Pulse 78   Resp 16   Ht 5' 8"  (1.727 m)   Wt 197 lb 3.2 oz (89.4 kg)   LMP 06/06/2016 (Approximate)   SpO2 100%   BMI 29.98 kg/m   Assessment and Plan: 1. Breast lump on left side at 3 o'clock position - MM Digital Diagnostic Unilat L; Future - US BREAST LTD UNI LEFT INC AXILLA; Future   Halina Maidens, MD [facility]  06/27/2016

## 2016-07-07 ENCOUNTER — Other Ambulatory Visit: Payer: Self-pay | Admitting: Internal Medicine

## 2016-07-07 ENCOUNTER — Ambulatory Visit
Admission: RE | Admit: 2016-07-07 | Discharge: 2016-07-07 | Disposition: A | Discharge status: Home / Self Care | Payer: BC Managed Care – PPO | Source: Ambulatory Visit | Attending: Internal Medicine | Admitting: Internal Medicine

## 2016-07-07 DIAGNOSIS — N6325 Unspecified lump in the left breast, overlapping quadrants: Secondary | ICD-10-CM

## 2016-07-07 DIAGNOSIS — N6002 Solitary cyst of left breast: Secondary | ICD-10-CM | POA: Diagnosis not present

## 2016-07-07 DIAGNOSIS — N6320 Unspecified lump in the left breast, unspecified quadrant: Principal | ICD-10-CM

## 2016-07-07 DIAGNOSIS — N63 Unspecified lump in breast: Secondary | ICD-10-CM | POA: Diagnosis present

## 2017-03-12 ENCOUNTER — Encounter: Payer: Self-pay | Admitting: Internal Medicine

## 2017-03-12 ENCOUNTER — Ambulatory Visit (INDEPENDENT_AMBULATORY_CARE_PROVIDER_SITE_OTHER): Payer: BC Managed Care – PPO | Admitting: Internal Medicine

## 2017-03-12 VITALS — BP 104/60 | HR 98 | Ht 68.0 in | Wt 193.2 lb

## 2017-03-12 DIAGNOSIS — E049 Nontoxic goiter, unspecified: Secondary | ICD-10-CM | POA: Diagnosis not present

## 2017-03-12 DIAGNOSIS — Z1239 Encounter for other screening for malignant neoplasm of breast: Secondary | ICD-10-CM

## 2017-03-12 DIAGNOSIS — F411 Generalized anxiety disorder: Secondary | ICD-10-CM

## 2017-03-12 DIAGNOSIS — Z1231 Encounter for screening mammogram for malignant neoplasm of breast: Secondary | ICD-10-CM | POA: Diagnosis not present

## 2017-03-12 DIAGNOSIS — Z0000 Encounter for general adult medical examination without abnormal findings: Secondary | ICD-10-CM

## 2017-03-12 LAB — POCT URINALYSIS DIPSTICK
Bilirubin, UA: NEGATIVE
Glucose, UA: NEGATIVE
Ketones, UA: NEGATIVE
LEUKOCYTES UA: NEGATIVE
Nitrite, UA: NEGATIVE
PROTEIN UA: NEGATIVE
RBC UA: NEGATIVE
SPEC GRAV UA: 1.02 (ref 1.010–1.025)
Urobilinogen, UA: 0.2 E.U./dL
pH, UA: 5 (ref 5.0–8.0)

## 2017-03-12 NOTE — Progress Notes (Signed)
Date:  03/12/2017   Name:  Lauren Cooke   DOB:  23-Feb-1971   MRN:  [id number]   Chief Complaint: Annual Exam (No pap- Breast exam.) Lauren Cooke is a 46 y.o. female who presents today for her Complete Annual Exam. She feels well. She reports exercising some.She has started Lauren Cooke to help with weight loss.  She reports she is sleeping fairly well. She denies breast complaints.  She sees Psychiatry for anxiety and sleep medications.   Anxiety  Presents for follow-up visit. Symptoms include insomnia and nervous/anxious behavior. Patient reports no chest pain, depressed mood, dizziness, irritability, palpitations, shortness of breath or suicidal ideas.    Insomnia  Primary symptoms: sleep disturbance, difficulty falling asleep, frequent awakening.  The current episode started more than one year. The problem occurs nightly. The problem is unchanged. Past treatments include medication Lauren Cooke). The treatment provided significant relief. Prior diagnostic workup includes:  No prior workup.      Review of Systems  Constitutional: Negative for chills, fatigue, fever and irritability.  HENT: Negative for congestion, hearing loss, tinnitus, trouble swallowing and voice change.   Eyes: Negative for visual disturbance.  Respiratory: Negative for cough, chest tightness, shortness of breath and wheezing.   Cardiovascular: Negative for chest pain, palpitations and leg swelling.  Gastrointestinal: Negative for abdominal pain, constipation, diarrhea and vomiting.  Endocrine: Negative for polydipsia and polyuria.  Genitourinary: Negative for dysuria, frequency, genital sores, vaginal bleeding and vaginal discharge.  Musculoskeletal: Negative for arthralgias, gait problem and joint swelling.  Skin: Negative for color change and rash.  Neurological: Positive for light-headedness (occasionally). Negative for dizziness, tremors and headaches.  Hematological: Negative for adenopathy. Does not  bruise/bleed easily.  Psychiatric/Behavioral: Positive for sleep disturbance. Negative for dysphoric mood and suicidal ideas. The patient is nervous/anxious and has insomnia.     Patient Active Problem List   Diagnosis Date Noted  . Enlarged thyroid gland 03/09/2016  . Chronic constipation 05/27/2015  . Anxiety, generalized 05/27/2015  . 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 L-S radiculopathy 05/27/2015    Prior to Admission medications   Medication Sig Start Date End Date Taking? Authorizing Provider       Historical Provider, MD  sertraline (ZOLOFT) 100 MG tablet 150 mg.  05/09/16   Historical Provider, MD  zolpidem (AMBIEN) 10 MG tablet Take 1 tablet by mouth at bedtime.    Historical Provider, MD    Allergies  Allergen Reactions  . Clindamycin Other (See Comments) and Anaphylaxis  . Cefuroxime Swelling  . Erythromycin Rash  . Penicillins Rash    Past Surgical History:  Procedure Laterality Date  . BREAST BIOPSY  2011   benign  . BREAST CYST ASPIRATION Right    negative 2011  . DILITATION & CURRETTAGE/HYSTROSCOPY WITH ESSURE    . ECTOPIC PREGNANCY SURGERY    . OVARIAN CYST REMOVAL    . REFRACTIVE SURGERY      Social History  Substance Use Topics  . Smoking status: Former Smoker    Quit date: 11/20/1996  . Smokeless tobacco: Former Systems developer  . Alcohol use 0.0 oz/week     Comment: occasional   Depression screen Lauren Cooke 2/9 05/15/2016  Decreased Interest 0  Down, Depressed, Hopeless 0  PHQ - 2 Score 0     Medication list has been reviewed and updated.   Physical Exam  Constitutional: She is oriented to person, place, and time. She appears well-developed and well-nourished. No distress.  HENT:  Head: Normocephalic and atraumatic.  Right Ear: Tympanic membrane and ear canal normal.  Left Ear: Tympanic membrane and ear canal normal.  Nose: Right sinus exhibits no maxillary sinus tenderness. Left sinus exhibits no maxillary sinus tenderness.    Mouth/Throat: Uvula is midline and oropharynx is clear and moist.  Eyes: Conjunctivae and EOM are normal. Right eye exhibits no discharge. Left eye exhibits no discharge. No scleral icterus.  Neck: Normal range of motion and full passive range of motion without pain. Neck supple. No muscular tenderness present. Carotid bruit is not present. No erythema present. Thyromegaly present.  Cardiovascular: Normal rate, regular rhythm, normal heart sounds and normal pulses.   Pulmonary/Chest: Effort normal. No respiratory distress. She has no wheezes. Right breast exhibits no mass, no nipple discharge, no skin change and no tenderness. Left breast exhibits mass (fibrocystic changes in outer left breast). Left breast exhibits no nipple discharge, no skin change and no tenderness.  Abdominal: Soft. Bowel sounds are normal. There is no hepatosplenomegaly. There is no tenderness. There is no CVA tenderness.  Musculoskeletal: Normal range of motion.  Lymphadenopathy:    She has no cervical adenopathy.    She has no axillary adenopathy.  Neurological: She is alert and oriented to person, place, and time. She has normal reflexes. No cranial nerve deficit or sensory deficit.  Skin: Skin is warm, dry and intact. No rash noted.  Psychiatric: She has a normal mood and affect. Her speech is normal and behavior is normal. Thought content normal.  Nursing note and vitals reviewed.   BP 104/60 (BP Location: Right Arm, Patient Position: Sitting, Cuff Size: Normal)   Pulse 98   Ht 5' 8"  (1.727 m)   Wt 193 lb 3.2 oz (87.6 kg)   LMP 02/26/2017 (Within Days)   SpO2 98%   BMI 29.38 kg/m   Assessment and Plan: 1. Annual physical exam Continue diet and weight loss with Lauren Cooke - CBC with Differential/Platelet - Comprehensive metabolic panel - Lipid panel - POCT urinalysis dipstick  2. Breast cancer screening - MM DIGITAL SCREENING BILATERAL; Future  3. Enlarged thyroid gland Stable without sx - TSH  4. Anxiety,  generalized Doing well on medication prescribed by Psychiatry   No orders of the defined types were placed in this encounter.   Lauren Maidens, MD [facility]  03/12/2017

## 2017-03-12 NOTE — Patient Instructions (Signed)
Health Maintenance, Female Adopting a healthy lifestyle and getting preventive care can go a long way to promote health and wellness. Talk with your health care provider about what schedule of regular examinations is right for you. This is a good chance for you to check in with your provider about disease prevention and staying healthy. In between checkups, there are plenty of things you can do on your own. Experts have done a lot of research about which lifestyle changes and preventive measures are most likely to keep you healthy. Ask your health care provider for more information. Weight and diet Eat a healthy diet  Be sure to include plenty of vegetables, fruits, low-fat dairy products, and lean protein.  Do not eat a lot of foods high in solid fats, added sugars, or salt.  Get regular exercise. This is one of the most important things you can do for your health.  Most adults should exercise for at least 150 minutes each week. The exercise should increase your heart rate and make you sweat (moderate-intensity exercise).  Most adults should also do strengthening exercises at least twice a week. This is in addition to the moderate-intensity exercise. Maintain a healthy weight  Body mass index (BMI) is a measurement that can be used to identify possible weight problems. It estimates body fat based on height and weight. Your health care provider can help determine your BMI and help you achieve or maintain a healthy weight.  For females 76 years of age and older:  A BMI below 18.5 is considered underweight.  A BMI of 18.5 to 24.9 is normal.  A BMI of 25 to 29.9 is considered overweight.  A BMI of 30 and above is considered obese. Watch levels of cholesterol and blood lipids  You should start having your blood tested for lipids and cholesterol at 45 years of age, then have this test every 5 years.  You may need to have your cholesterol levels checked more often if:  Your lipid or  cholesterol levels are high.  You are older than 45 years of age.  You are at high risk for heart disease. Cancer screening Lung Cancer  Lung cancer screening is recommended for adults 64-42 years old who are at high risk for lung cancer because of a history of smoking.  A yearly low-dose CT scan of the lungs is recommended for people who:  Currently smoke.  Have quit within the past 15 years.  Have at least a 30-pack-year history of smoking. A pack year is smoking an average of one pack of cigarettes a day for 1 year.  Yearly screening should continue until it has been 15 years since you quit.  Yearly screening should stop if you develop a health problem that would prevent you from having lung cancer treatment. Breast Cancer  Practice breast self-awareness. This means understanding how your breasts normally appear and feel.  It also means doing regular breast self-exams. Let your health care provider know about any changes, no matter how small.  If you are in your 20s or 30s, you should have a clinical breast exam (CBE) by a health care provider every 1-3 years as part of a regular health exam.  If you are 34 or older, have a CBE every year. Also consider having a breast X-ray (mammogram) every year.  If you have a family history of breast cancer, talk to your health care provider about genetic screening.  If you are at high risk for breast cancer, talk  to your health care provider about having an MRI and a mammogram every year.  Breast cancer gene (BRCA) assessment is recommended for women who have family members with BRCA-related cancers. BRCA-related cancers include:  Breast.  Ovarian.  Tubal.  Peritoneal cancers.  Results of the assessment will determine the need for genetic counseling and BRCA1 and BRCA2 testing. Cervical Cancer  Your health care provider may recommend that you be screened regularly for cancer of the pelvic organs (ovaries, uterus, and vagina).  This screening involves a pelvic examination, including checking for microscopic changes to the surface of your cervix (Pap test). You may be encouraged to have this screening done every 3 years, beginning at age 24.  For women ages 66-65, health care providers may recommend pelvic exams and Pap testing every 3 years, or they may recommend the Pap and pelvic exam, combined with testing for human papilloma virus (HPV), every 5 years. Some types of HPV increase your risk of cervical cancer. Testing for HPV may also be done on women of any age with unclear Pap test results.  Other health care providers may not recommend any screening for nonpregnant women who are considered low risk for pelvic cancer and who do not have symptoms. Ask your health care provider if a screening pelvic exam is right for you.  If you have had past treatment for cervical cancer or a condition that could lead to cancer, you need Pap tests and screening for cancer for at least 20 years after your treatment. If Pap tests have been discontinued, your risk factors (such as having a new sexual partner) need to be reassessed to determine if screening should resume. Some women have medical problems that increase the chance of getting cervical cancer. In these cases, your health care provider may recommend more frequent screening and Pap tests. Colorectal Cancer  This type of cancer can be detected and often prevented.  Routine colorectal cancer screening usually begins at 45 years of age and continues through 45 years of age.  Your health care provider may recommend screening at an earlier age if you have risk factors for colon cancer.  Your health care provider may also recommend using home test kits to check for hidden blood in the stool.  A small camera at the end of a tube can be used to examine your colon directly (sigmoidoscopy or colonoscopy). This is done to check for the earliest forms of colorectal cancer.  Routine  screening usually begins at age 41.  Direct examination of the colon should be repeated every 5-10 years through 45 years of age. However, you may need to be screened more often if early forms of precancerous polyps or small growths are found. Skin Cancer  Check your skin from head to toe regularly.  Tell your health care provider about any new moles or changes in moles, especially if there is a change in a mole's shape or color.  Also tell your health care provider if you have a mole that is larger than the size of a pencil eraser.  Always use sunscreen. Apply sunscreen liberally and repeatedly throughout the day.  Protect yourself by wearing long sleeves, pants, a wide-brimmed hat, and sunglasses whenever you are outside. Heart disease, diabetes, and high blood pressure  High blood pressure causes heart disease and increases the risk of stroke. High blood pressure is more likely to develop in:  People who have blood pressure in the high end of the normal range (130-139/85-89 mm Hg).  People who are overweight or obese.  People who are African American.  If you are 59-24 years of age, have your blood pressure checked every 3-5 years. If you are 34 years of age or older, have your blood pressure checked every year. You should have your blood pressure measured twice-once when you are at a hospital or clinic, and once when you are not at a hospital or clinic. Record the average of the two measurements. To check your blood pressure when you are not at a hospital or clinic, you can use:  An automated blood pressure machine at a pharmacy.  A home blood pressure monitor.  If you are between 29 years and 60 years old, ask your health care provider if you should take aspirin to prevent strokes.  Have regular diabetes screenings. This involves taking a blood sample to check your fasting blood sugar level.  If you are at a normal weight and have a low risk for diabetes, have this test once  every three years after 45 years of age.  If you are overweight and have a high risk for diabetes, consider being tested at a younger age or more often. Preventing infection Hepatitis B  If you have a higher risk for hepatitis B, you should be screened for this virus. You are considered at high risk for hepatitis B if:  You were born in a country where hepatitis B is common. Ask your health care provider which countries are considered high risk.  Your parents were born in a high-risk country, and you have not been immunized against hepatitis B (hepatitis B vaccine).  You have HIV or AIDS.  You use needles to inject street drugs.  You live with someone who has hepatitis B.  You have had sex with someone who has hepatitis B.  You get hemodialysis treatment.  You take certain medicines for conditions, including cancer, organ transplantation, and autoimmune conditions. Hepatitis C  Blood testing is recommended for:  Everyone born from [birth year] through 1964.  Anyone with known risk factors for hepatitis C. Sexually transmitted infections (STIs)  You should be screened for sexually transmitted infections (STIs) including gonorrhea and chlamydia if:  You are sexually active and are younger than 45 years of age.  You are older than 45 years of age and your health care provider tells you that you are at risk for this type of infection.  Your sexual activity has changed since you were last screened and you are at an increased risk for chlamydia or gonorrhea. Ask your health care provider if you are at risk.  If you do not have HIV, but are at risk, it may be recommended that you take a prescription medicine daily to prevent HIV infection. This is called pre-exposure prophylaxis (PrEP). You are considered at risk if:  You are sexually active and do not regularly use condoms or know the HIV status of your partner(s).  You take drugs by injection.  You are sexually active with a partner  who has HIV. Talk with your health care provider about whether you are at high risk of being infected with HIV. If you choose to begin PrEP, you should first be tested for HIV. You should then be tested every 3 months for as long as you are taking PrEP. Pregnancy  If you are premenopausal and you may become pregnant, ask your health care provider about preconception counseling.  If you may become pregnant, take 400 to 800 micrograms (mcg) of folic acid  every day.  If you want to prevent pregnancy, talk to your health care provider about birth control (contraception). Osteoporosis and menopause  Osteoporosis is a disease in which the bones lose minerals and strength with aging. This can result in serious bone fractures. Your risk for osteoporosis can be identified using a bone density scan.  If you are 4 years of age or older, or if you are at risk for osteoporosis and fractures, ask your health care provider if you should be screened.  Ask your health care provider whether you should take a calcium or vitamin D supplement to lower your risk for osteoporosis.  Menopause may have certain physical symptoms and risks.  Hormone replacement therapy may reduce some of these symptoms and risks. Talk to your health care provider about whether hormone replacement therapy is right for you. Follow these instructions at home:  Schedule regular health, dental, and eye exams.  Stay current with your immunizations.  Do not use any tobacco products including cigarettes, chewing tobacco, or electronic cigarettes.  If you are pregnant, do not drink alcohol.  If you are breastfeeding, limit how much and how often you drink alcohol.  Limit alcohol intake to no more than 1 drink per day for nonpregnant women. One drink equals 12 ounces of beer, 5 ounces of wine, or 1 ounces of hard liquor.  Do not use street drugs.  Do not share needles.  Ask your health care provider for help if you need support  or information about quitting drugs.  Tell your health care provider if you often feel depressed.  Tell your health care provider if you have ever been abused or do not feel safe at home. This information is not intended to replace advice given to you by your health care provider. Make sure you discuss any questions you have with your health care provider. Document Released: 05/22/2011 Document Revised: 04/13/2016 Document Reviewed: 08/10/2015 Elsevier Interactive Patient Education  2017 [employer].

## 2017-03-13 LAB — COMPREHENSIVE METABOLIC PANEL
ALT: 12 IU/L (ref 0–32)
AST: 13 IU/L (ref 0–40)
Albumin/Globulin Ratio: 1.5 (ref 1.2–2.2)
Albumin: 4.3 g/dL (ref 3.5–5.5)
Alkaline Phosphatase: 88 IU/L (ref 39–117)
BUN/Creatinine Ratio: 19 (ref 9–23)
BUN: 15 mg/dL (ref 6–24)
Bilirubin Total: 0.2 mg/dL (ref 0.0–1.2)
CO2: 26 mmol/L (ref 18–29)
Calcium: 9.2 mg/dL (ref 8.7–10.2)
Chloride: 101 mmol/L (ref 96–106)
Creatinine, Ser: 0.78 mg/dL (ref 0.57–1.00)
GFR, EST AFRICAN AMERICAN: 106 mL/min/{1.73_m2} (ref >59)
GFR, EST NON AFRICAN AMERICAN: 92 mL/min/{1.73_m2} (ref >59)
Globulin, Total: 2.8 g/dL (ref 1.5–4.5)
Glucose: 97 mg/dL (ref 65–99)
Potassium: 4.1 mmol/L (ref 3.5–5.2)
Sodium: 142 mmol/L (ref 134–144)
TOTAL PROTEIN: 7.1 g/dL (ref 6.0–8.5)

## 2017-03-13 LAB — CBC WITH DIFFERENTIAL/PLATELET
BASOS: 1 %
Basophils Absolute: 0 10*3/uL (ref 0.0–0.2)
EOS (ABSOLUTE): 0.1 10*3/uL (ref 0.0–0.4)
Eos: 2 %
Hematocrit: 37 % (ref 34.0–46.6)
Hemoglobin: 11.9 g/dL (ref 11.1–15.9)
IMMATURE GRANS (ABS): 0 10*3/uL (ref 0.0–0.1)
IMMATURE GRANULOCYTES: 0 %
LYMPHS: 27 %
Lymphocytes Absolute: 1.6 10*3/uL (ref 0.7–3.1)
MCH: 28.5 pg (ref 26.6–33.0)
MCHC: 32.2 g/dL (ref 31.5–35.7)
MCV: 89 fL (ref 79–97)
MONOCYTES: 5 %
Monocytes Absolute: 0.3 10*3/uL (ref 0.1–0.9)
Neutrophils Absolute: 4 10*3/uL (ref 1.4–7.0)
Neutrophils: 65 %
PLATELETS: 285 10*3/uL (ref 150–379)
RBC: 4.17 x10E6/uL (ref 3.77–5.28)
RDW: 13.7 % (ref 12.3–15.4)
WBC: 6.1 10*3/uL (ref 3.4–10.8)

## 2017-03-13 LAB — LIPID PANEL
Chol/HDL Ratio: 3.5 ratio (ref 0.0–4.4)
Cholesterol, Total: 209 mg/dL — ABNORMAL HIGH (ref 100–199)
HDL: 59 mg/dL (ref >39)
LDL Calculated: 136 mg/dL — ABNORMAL HIGH (ref 0–99)
TRIGLYCERIDES: 70 mg/dL (ref 0–149)
VLDL CHOLESTEROL CAL: 14 mg/dL (ref 5–40)

## 2017-03-13 LAB — TSH: TSH: 1.67 u[IU]/mL (ref 0.450–4.500)

## 2017-03-21 ENCOUNTER — Ambulatory Visit
Admission: RE | Admit: 2017-03-21 | Discharge: 2017-03-21 | Disposition: A | Discharge status: Home / Self Care | Payer: BC Managed Care – PPO | Source: Ambulatory Visit | Attending: Internal Medicine | Admitting: Internal Medicine

## 2017-03-21 ENCOUNTER — Other Ambulatory Visit: Payer: Self-pay | Admitting: Internal Medicine

## 2017-03-21 DIAGNOSIS — Z1239 Encounter for other screening for malignant neoplasm of breast: Secondary | ICD-10-CM

## 2017-03-21 DIAGNOSIS — Z1231 Encounter for screening mammogram for malignant neoplasm of breast: Secondary | ICD-10-CM | POA: Insufficient documentation

## 2018-07-11 ENCOUNTER — Other Ambulatory Visit: Payer: Self-pay | Admitting: Internal Medicine

## 2018-07-11 DIAGNOSIS — Z1231 Encounter for screening mammogram for malignant neoplasm of breast: Secondary | ICD-10-CM

## 2018-07-16 ENCOUNTER — Ambulatory Visit
Admission: RE | Admit: 2018-07-16 | Discharge: 2018-07-16 | Disposition: A | Discharge status: Home / Self Care | Payer: BC Managed Care – PPO | Source: Ambulatory Visit | Attending: Internal Medicine | Admitting: Internal Medicine

## 2018-07-16 DIAGNOSIS — Z1231 Encounter for screening mammogram for malignant neoplasm of breast: Secondary | ICD-10-CM | POA: Diagnosis not present

## 2019-07-07 ENCOUNTER — Encounter: Payer: Self-pay | Admitting: Internal Medicine

## 2019-07-07 ENCOUNTER — Other Ambulatory Visit: Payer: Self-pay

## 2019-07-07 ENCOUNTER — Other Ambulatory Visit: Payer: Self-pay | Admitting: Internal Medicine

## 2019-07-07 ENCOUNTER — Ambulatory Visit: Payer: BC Managed Care – PPO | Admitting: Internal Medicine

## 2019-07-07 VITALS — BP 118/78 | HR 114 | Ht 68.0 in | Wt 199.0 lb

## 2019-07-07 DIAGNOSIS — N6012 Diffuse cystic mastopathy of left breast: Secondary | ICD-10-CM

## 2019-07-07 DIAGNOSIS — Z1231 Encounter for screening mammogram for malignant neoplasm of breast: Secondary | ICD-10-CM

## 2019-07-07 NOTE — Progress Notes (Signed)
Date:  07/07/2019   Name:  Lauren Cooke   DOB:  1971-07-23   MRN:  [id number]   Chief Complaint: Breast Mass (Knot in left breast. Not painful. Breast exam. Can move around in breast.) Patient has had fibrocystic breast changes for a number years.  She has had cysts aspirated in the past but never had a biopsy.  She call ed schedule her annual mammogram and mentioned that she had a new cyst on the left breast. She has noticed these cysts for several years but recently there seem to be a few more.  No tenderness, no nipple discharge, no skin dimpling.  HPI  Review of Systems  Constitutional: Negative for diaphoresis and fever.  Respiratory: Negative for chest tightness and shortness of breath.   Cardiovascular: Negative for chest pain and palpitations.  Genitourinary:       Left breast cystic changes  Neurological: Negative for dizziness and headaches.    Patient Active Problem List   Diagnosis Date Noted  . Enlarged thyroid gland 03/09/2016  . Chronic constipation 05/27/2015  . Anxiety, generalized 05/27/2015  . 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 L-S radiculopathy 05/27/2015    Allergies  Allergen Reactions  . Clindamycin Other (See Comments) and Anaphylaxis  . Cefuroxime Swelling  . Erythromycin Rash  . Penicillins Rash    Past Surgical History:  Procedure Laterality Date  . BREAST BIOPSY Bilateral 2011   benign-needle bx  . BREAST CYST ASPIRATION Right    negative 2011  . DILITATION & CURRETTAGE/HYSTROSCOPY WITH ESSURE    . ECTOPIC PREGNANCY SURGERY    . OVARIAN CYST REMOVAL    . REFRACTIVE SURGERY      Social History   Tobacco Use  . Smoking status: Former Smoker    Quit date: 11/20/1996    Years since quitting: 22.6  . Smokeless tobacco: Former Network engineer Use Topics  . Alcohol use: Yes    Alcohol/week: 0.0 standard drinks    Comment: occasional  . Drug use: No     Medication list has been reviewed and updated.  Current Meds  Medication Sig  . clonazePAM (KLONOPIN) 0.5 MG tablet Take 1 tablet by mouth as needed.  . sertraline (ZOLOFT) 100 MG tablet Take 150 mg by mouth daily.   Marland Kitchen zolpidem (AMBIEN) 10 MG tablet Take 1 tablet by mouth as needed.     PHQ 2/9 Scores 07/07/2019 05/15/2016  PHQ - 2 Score 0 0    BP Readings from Last 3 Encounters:  07/07/19 118/78  03/12/17 104/60  06/27/16 112/80    Physical Exam Vitals signs and nursing note reviewed.  Constitutional:      General: She is not in acute distress.    Appearance: She is well-developed.  HENT:     Head: Normocephalic and atraumatic.  Cardiovascular:     Rate and Rhythm: Normal rate and regular rhythm.  Pulmonary:     Effort: Pulmonary effort is normal. No respiratory distress.     Breath sounds: No wheezing or rhonchi.  Chest:     Breasts:        Right: No inverted nipple, mass, nipple discharge, skin change or tenderness.        Left: Mass present. No inverted nipple, nipple discharge, skin change or tenderness.    Musculoskeletal: Normal range of motion.  Skin:    General: Skin is warm and dry.     Findings: No rash.  Neurological:  Mental Status: She is alert and oriented to person, place, and time.  Psychiatric:        Behavior: Behavior normal.        Thought Content: Thought content normal.     Wt Readings from Last 3 Encounters:  07/07/19 199 lb (90.3 kg)  03/12/17 193 lb 3.2 oz (87.6 kg)  06/27/16 197 lb 3.2 oz (89.4 kg)    BP 118/78   Pulse (!) 114   Ht 5' 8"  (1.727 m)   Wt 199 lb (90.3 kg)   LMP 06/25/2019   SpO2 97%   BMI 30.26 kg/m   Assessment and Plan: 1. Fibrocystic breast changes, left - MM DIAG BREAST TOMO BILATERAL; Future - US BREAST LTD UNI LEFT INC AXILLA; Future - US BREAST LTD UNI RIGHT INC AXILLA; Future  2. Encounter for screening mammogram for breast cancer - MM DIAG BREAST TOMO BILATERAL; Future   Partially dictated using Editor, commissioning. Any errors are unintentional.   Halina Maidens, MD [facility]  07/07/2019

## 2019-07-15 ENCOUNTER — Ambulatory Visit
Admission: RE | Admit: 2019-07-15 | Discharge: 2019-07-15 | Disposition: A | Discharge status: Home / Self Care | Payer: BC Managed Care – PPO | Source: Ambulatory Visit | Attending: Internal Medicine | Admitting: Internal Medicine

## 2019-07-15 DIAGNOSIS — Z1231 Encounter for screening mammogram for malignant neoplasm of breast: Secondary | ICD-10-CM | POA: Diagnosis present

## 2019-07-15 DIAGNOSIS — N6012 Diffuse cystic mastopathy of left breast: Secondary | ICD-10-CM | POA: Diagnosis not present

## 2019-07-31 ENCOUNTER — Other Ambulatory Visit: Payer: Self-pay

## 2019-07-31 DIAGNOSIS — N61 Mastitis without abscess: Secondary | ICD-10-CM

## 2019-07-31 MED ORDER — LEVOFLOXACIN 500 MG PO TABS: 500.0000 mg | ORAL_TABLET | Freq: Every day | ORAL | 0 refills | Status: DC

## 2019-07-31 NOTE — Progress Notes (Signed)
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 She had this 3 years ago. Recently had a mammogram and it showed a cyst but no masses. Wants abx sent in  Told her per Hustler we can send in 10 days of abx. If does not get better after treatment she may need to see UC.  She verbalized understanding.

## 2019-09-12 ENCOUNTER — Ambulatory Visit (INDEPENDENT_AMBULATORY_CARE_PROVIDER_SITE_OTHER): Payer: BC Managed Care – PPO | Admitting: Internal Medicine

## 2019-09-12 ENCOUNTER — Encounter: Payer: Self-pay | Admitting: Internal Medicine

## 2019-09-12 ENCOUNTER — Other Ambulatory Visit: Payer: Self-pay

## 2019-09-12 VITALS — BP 122/78 | HR 96 | Ht 68.0 in | Wt 206.0 lb

## 2019-09-12 DIAGNOSIS — G4700 Insomnia, unspecified: Secondary | ICD-10-CM

## 2019-09-12 DIAGNOSIS — G44219 Episodic tension-type headache, not intractable: Secondary | ICD-10-CM | POA: Diagnosis not present

## 2019-09-12 DIAGNOSIS — E049 Nontoxic goiter, unspecified: Secondary | ICD-10-CM

## 2019-09-12 DIAGNOSIS — Z1231 Encounter for screening mammogram for malignant neoplasm of breast: Secondary | ICD-10-CM | POA: Diagnosis not present

## 2019-09-12 DIAGNOSIS — Z0000 Encounter for general adult medical examination without abnormal findings: Secondary | ICD-10-CM

## 2019-09-12 DIAGNOSIS — F411 Generalized anxiety disorder: Secondary | ICD-10-CM | POA: Diagnosis not present

## 2019-09-12 LAB — POCT URINALYSIS DIPSTICK
Bilirubin, UA: NEGATIVE
Glucose, UA: NEGATIVE
Ketones, UA: NEGATIVE
Leukocytes, UA: NEGATIVE
Nitrite, UA: NEGATIVE
Protein, UA: NEGATIVE
Spec Grav, UA: 1.015 (ref 1.010–1.025)
Urobilinogen, UA: 0.2 E.U./dL
pH, UA: 5 (ref 5.0–8.0)

## 2019-09-12 NOTE — Progress Notes (Signed)
Date:  09/12/2019   Name:  Lauren Cooke   DOB:  12/27/1970   MRN:  [id number]   Chief Complaint: Annual Exam (Breast Exam. Pap 2 more years.) Lauren Cooke is a 47 y.o. female who presents today for her Complete Annual Exam. She feels well. She reports exercising rarely. She reports she is sleeping fairly well. She denies breast issues - recently had mammogram with multiple benign cyst.  Menses are still regular.  Mammogram  06/2019 Pap  02/2016 - neg with cotesting  Insomnia Primary symptoms: no sleep disturbance.  The problem occurs every several days. Past treatments include medication (prescribed by psychiatrist). The treatment provided significant relief.  Anxiety Presents for follow-up visit. Symptoms include insomnia. Patient reports no chest pain, dizziness, nausea, nervous/anxious behavior, palpitations or shortness of breath. Symptoms occur rarely. The quality of sleep is good.   Compliance with medications is 76-100% (prescribed by psychiatrist).  Headache  This is a recurrent problem. The problem occurs monthly. The problem has been unchanged. The pain is located in the occipital region. The pain does not radiate. The quality of the pain is described as aching. The pain is mild. Associated symptoms include insomnia. Pertinent negatives include no abdominal pain, coughing, dizziness, fever, hearing loss, nausea, numbness, phonophobia, photophobia, tinnitus or weakness. The symptoms are aggravated by menstrual cycle. She has tried NSAIDs and acetaminophen for the symptoms. The treatment provided mild relief.    Review of Systems  Constitutional: Negative for chills, fatigue and fever.  HENT: Negative for congestion, hearing loss, tinnitus, trouble swallowing and voice change.   Eyes: Negative for photophobia and visual disturbance.  Respiratory: Negative for cough, chest tightness, shortness of breath and wheezing.   Cardiovascular: Negative for chest pain, palpitations  and leg swelling.  Gastrointestinal: Negative for abdominal pain, constipation, diarrhea and nausea.  Endocrine: Negative for polydipsia and polyuria.  Genitourinary: Negative for dysuria, frequency, genital sores, vaginal bleeding and vaginal discharge.  Musculoskeletal: Negative for arthralgias, gait problem and joint swelling.  Skin: Negative for color change and rash.  Neurological: Positive for headaches (tension type headaches with menses). Negative for dizziness, tremors, weakness, light-headedness and numbness.  Hematological: Negative for adenopathy. Does not bruise/bleed easily.  Psychiatric/Behavioral: Negative for dysphoric mood and sleep disturbance. The patient has insomnia. The patient is not nervous/anxious.     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   L-S radiculopathy 05/27/2015    Allergies  Allergen Reactions   Clindamycin Other (See Comments) and Anaphylaxis   Cefuroxime Swelling   Erythromycin Rash   Penicillins Rash    Past Surgical History:  Procedure Laterality Date   BREAST BIOPSY Bilateral 2011   benign-needle bx   BREAST CYST ASPIRATION Right    negative 2011   BREAST CYST ASPIRATION Left    DILITATION & CURRETTAGE/HYSTROSCOPY WITH ESSURE     ECTOPIC PREGNANCY SURGERY     OVARIAN CYST REMOVAL     REFRACTIVE SURGERY      Social History   Tobacco Use   Smoking status: Former Smoker    Quit date: 11/20/1996    Years since quitting: 22.8   Smokeless tobacco: Former Systems developer  Substance Use Topics   Alcohol use: Yes    Alcohol/week: 0.0 standard drinks    Comment: occasional   Drug use: No  Medication list has been reviewed and updated.  Current Meds  Medication Sig   clonazePAM (KLONOPIN) 0.5 MG tablet Take 1 tablet by mouth as needed.    sertraline (ZOLOFT) 100 MG tablet Take 150 mg by mouth daily.    zolpidem (AMBIEN) 10 MG tablet Take 1 tablet by mouth as needed.     PHQ 2/9 Scores 09/12/2019 07/07/2019 05/15/2016  PHQ - 2 Score 0 0 0    BP Readings from Last 3 Encounters:  09/12/19 122/78  07/07/19 118/78  03/12/17 104/60    Physical Exam Vitals signs and nursing note reviewed.  Constitutional:      General: She is not in acute distress.    Appearance: She is well-developed.  HENT:     Head: Normocephalic and atraumatic.     Right Ear: Tympanic membrane and ear canal normal.     Left Ear: Tympanic membrane and ear canal normal.     Nose:     Right Sinus: No maxillary sinus tenderness.     Left Sinus: No maxillary sinus tenderness.  Eyes:     General: No scleral icterus.       Right eye: No discharge.        Left eye: No discharge.     Conjunctiva/sclera: Conjunctivae normal.  Neck:     Musculoskeletal: Normal range of motion. No erythema.     Thyroid: No thyroid mass or thyromegaly.     Vascular: No carotid bruit.     Comments: Sternomastoid muscles prominent bilaterally - no discrete mass or obvious thyroid enlargement Cardiovascular:     Rate and Rhythm: Normal rate and regular rhythm.     Pulses: Normal pulses.     Heart sounds: Normal heart sounds.  Pulmonary:     Effort: Pulmonary effort is normal. No respiratory distress.     Breath sounds: No wheezing.  Chest:     Breasts:        Right: No mass, nipple discharge, skin change or tenderness.        Left: No mass, nipple discharge, skin change or tenderness.  Abdominal:     General: Bowel sounds are normal.     Palpations: Abdomen is soft.     Tenderness: There is no abdominal tenderness.  Musculoskeletal: Normal range of motion.     Cervical back: Normal. She exhibits no tenderness, no bony tenderness and no spasm.  Lymphadenopathy:     Cervical: No cervical adenopathy.  Skin:    General: Skin is warm and dry.     Findings: No rash.    Neurological:     Mental Status: She is alert and oriented to person, place, and time.     Cranial Nerves: No cranial nerve deficit.     Sensory: No sensory deficit.     Motor: Motor function is intact.     Coordination: Coordination is intact.     Deep Tendon Reflexes: Reflexes are normal and symmetric.  Psychiatric:        Speech: Speech normal.        Behavior: Behavior normal.        Thought Content: Thought content normal.     Wt Readings from Last 3 Encounters:  09/12/19 206 lb (93.4 kg)  07/07/19 199 lb (90.3 kg)  03/12/17 193 lb 3.2 oz (87.6 kg)    BP 122/78    Pulse 96    Ht 5' 8"  (1.727 m)    Wt 206 lb (93.4 kg)    SpO2  95%    BMI 31.32 kg/m   Assessment and Plan: 1. Annual physical exam Normal exam Continue healthy diet, regular exercise - CBC with Differential/Platelet - Comprehensive metabolic panel - Lipid panel - POCT urinalysis dipstick  2. Encounter for screening mammogram for breast cancer Recently completed with cystic changes bilaterally Recommend Vit E and B12, limit caffeine Return to annual screening  3. Anxiety, generalized Mild, controlled with medication and followed by Psych  4. Insomnia disorder related to known organic factor Continue ambien PRN No evidence of dependence or adverse side effects  5. Enlarged thyroid gland Does not appear to be enlarged gland on exam but pt reports having thyroid US in the past reported to be normal.  Certainly no focal mass is noted If labs normal, no further evaluation unless there is change - TSH + free T4  6. Episodic tension-type headache, not intractable Menstrual related by history without worrisome features Recommend Excedrin migraine PRN   Partially dictated using Editor, commissioning. Any errors are unintentional.  Halina Maidens, MD [facility]  09/12/2019

## 2019-09-12 NOTE — Patient Instructions (Signed)
Try Excedrin Migraine - take at onset of headache as needed

## 2019-09-13 LAB — COMPREHENSIVE METABOLIC PANEL
ALT: 11 IU/L (ref 0–32)
AST: 12 IU/L (ref 0–40)
Albumin/Globulin Ratio: 1.7 (ref 1.2–2.2)
Albumin: 4.2 g/dL (ref 3.8–4.8)
Alkaline Phosphatase: 92 IU/L (ref 39–117)
BUN/Creatinine Ratio: 16 (ref 9–23)
BUN: 13 mg/dL (ref 6–24)
Bilirubin Total: 0.2 mg/dL (ref 0.0–1.2)
CO2: 23 mmol/L (ref 20–29)
Calcium: 9.2 mg/dL (ref 8.7–10.2)
Chloride: 102 mmol/L (ref 96–106)
Creatinine, Ser: 0.82 mg/dL (ref 0.57–1.00)
GFR calc Af Amer: 99 mL/min/{1.73_m2} (ref >59)
GFR calc non Af Amer: 85 mL/min/{1.73_m2} (ref >59)
Globulin, Total: 2.5 g/dL (ref 1.5–4.5)
Glucose: 97 mg/dL (ref 65–99)
Potassium: 4.2 mmol/L (ref 3.5–5.2)
Sodium: 141 mmol/L (ref 134–144)
Total Protein: 6.7 g/dL (ref 6.0–8.5)

## 2019-09-13 LAB — CBC WITH DIFFERENTIAL/PLATELET
Basophils Absolute: 0 10*3/uL (ref 0.0–0.2)
Basos: 1 %
EOS (ABSOLUTE): 0.2 10*3/uL (ref 0.0–0.4)
Eos: 3 %
Hematocrit: 36.8 % (ref 34.0–46.6)
Hemoglobin: 12.3 g/dL (ref 11.1–15.9)
Immature Grans (Abs): 0 10*3/uL (ref 0.0–0.1)
Immature Granulocytes: 0 %
Lymphocytes Absolute: 1.3 10*3/uL (ref 0.7–3.1)
Lymphs: 21 %
MCH: 29.2 pg (ref 26.6–33.0)
MCHC: 33.4 g/dL (ref 31.5–35.7)
MCV: 87 fL (ref 79–97)
Monocytes Absolute: 0.6 10*3/uL (ref 0.1–0.9)
Monocytes: 9 %
Neutrophils Absolute: 4 10*3/uL (ref 1.4–7.0)
Neutrophils: 66 %
Platelets: 282 10*3/uL (ref 150–450)
RBC: 4.21 x10E6/uL (ref 3.77–5.28)
RDW: 12.6 % (ref 11.7–15.4)
WBC: 6.1 10*3/uL (ref 3.4–10.8)

## 2019-09-13 LAB — LIPID PANEL
Chol/HDL Ratio: 3.3 ratio (ref 0.0–4.4)
Cholesterol, Total: 194 mg/dL (ref 100–199)
HDL: 59 mg/dL (ref >39)
LDL Chol Calc (NIH): 121 mg/dL — ABNORMAL HIGH (ref 0–99)
Triglycerides: 74 mg/dL (ref 0–149)
VLDL Cholesterol Cal: 14 mg/dL (ref 5–40)

## 2019-09-13 LAB — TSH+FREE T4
Free T4: 0.92 ng/dL (ref 0.82–1.77)
TSH: 1.24 u[IU]/mL (ref 0.450–4.500)

## 2019-12-01 ENCOUNTER — Encounter: Payer: Self-pay | Admitting: Internal Medicine

## 2019-12-01 ENCOUNTER — Ambulatory Visit (INDEPENDENT_AMBULATORY_CARE_PROVIDER_SITE_OTHER): Payer: BC Managed Care – PPO | Admitting: Internal Medicine

## 2019-12-01 ENCOUNTER — Other Ambulatory Visit: Payer: Self-pay

## 2019-12-01 VITALS — BP 118/70 | HR 96 | Temp 99.1°F | Ht 68.0 in | Wt 204.0 lb

## 2019-12-01 DIAGNOSIS — J0100 Acute maxillary sinusitis, unspecified: Secondary | ICD-10-CM

## 2019-12-01 MED ORDER — PREDNISONE 10 MG PO TABS: 10.0000 mg | ORAL_TABLET | ORAL | 0 refills | Status: AC

## 2019-12-01 MED ORDER — DOXYCYCLINE HYCLATE 100 MG PO TABS: 100.0000 mg | ORAL_TABLET | Freq: Two times a day (BID) | ORAL | 0 refills | Status: DC

## 2019-12-01 NOTE — Progress Notes (Signed)
Date:  12/01/2019   Name:  Lauren Cooke   DOB:  [birth year]   MRN:  [id number]   Chief Complaint: Sinusitis (Sinus pain in cheeks and ear pressure mainly in left ear. X 1 week. Drainage. No fever or cough- no loss of taste or smell. )  Sinusitis This is a new problem. The current episode started in the past 7 days. There has been no fever. The pain is mild. Associated symptoms include congestion, ear pain, headaches and sinus pressure. Pertinent negatives include no chills, coughing, diaphoresis, shortness of breath, sore throat or swollen glands. Past treatments include saline sprays and spray decongestants.    Lab Results  Component Value Date   CREATININE 0.82 09/12/2019   BUN 13 09/12/2019   NA 141 09/12/2019   K 4.2 09/12/2019   CL 102 09/12/2019   CO2 23 09/12/2019   Lab Results  Component Value Date   CHOL 194 09/12/2019   HDL 59 09/12/2019   LDLCALC 121 (H) 09/12/2019   TRIG 74 09/12/2019   CHOLHDL 3.3 09/12/2019   Lab Results  Component Value Date   TSH 1.240 09/12/2019   No results found for: HGBA1C   Review of Systems  Constitutional: Negative for chills, diaphoresis, fatigue and fever.  HENT: Positive for congestion, ear pain, postnasal drip and sinus pressure. Negative for sore throat and trouble swallowing.        No change in taste or smell  Respiratory: Negative for cough, chest tightness, shortness of breath and wheezing.   Cardiovascular: Negative for chest pain.  Gastrointestinal: Negative for abdominal pain, diarrhea and nausea.  Musculoskeletal: Negative for myalgias.  Neurological: Positive for headaches. Negative for dizziness.  Psychiatric/Behavioral: Negative for sleep disturbance.    Patient Active Problem List   Diagnosis Date Noted  . Enlarged thyroid gland 03/09/2016  . Chronic constipation 05/27/2015  . Anxiety, generalized 05/27/2015  . 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 L-S radiculopathy 05/27/2015      Allergies  Allergen Reactions  . Clindamycin Other (See Comments) and Anaphylaxis  . Cefuroxime Swelling  . Erythromycin Rash  . Penicillins Rash    Past Surgical History:  Procedure Laterality Date  . BREAST BIOPSY Bilateral 2011   benign-needle bx  . BREAST CYST ASPIRATION Right    negative 2011  . BREAST CYST ASPIRATION Left   . DILITATION & CURRETTAGE/HYSTROSCOPY WITH ESSURE    . ECTOPIC PREGNANCY SURGERY    . OVARIAN CYST REMOVAL    . REFRACTIVE SURGERY      Social History   Tobacco Use  . Smoking status: Former Smoker    Quit date: 11/20/1996    Years since quitting: 23.0  . Smokeless tobacco: Former Network engineer Use Topics  . Alcohol use: Yes    Alcohol/week: 0.0 standard drinks    Comment: occasional  . Drug use: No     Medication list has been reviewed and updated.  Current Meds  Medication Sig  . clonazePAM (KLONOPIN) 0.5 MG tablet Take 1 tablet by mouth as needed.  . sertraline (ZOLOFT) 100 MG tablet Take 150 mg by mouth daily.   . vitamin E (VITAMIN E) 400 UNIT capsule Take 400 Units by mouth daily.  Marland Kitchen zolpidem (AMBIEN) 10 MG tablet Take 1 tablet by mouth as needed.     PHQ 2/9 Scores 12/01/2019 09/12/2019 07/07/2019 05/15/2016  PHQ - 2 Score 0 0 0 0    BP Readings from Last 3 Encounters:  12/01/19 118/70  09/12/19 122/78  07/07/19 118/78    Physical Exam Constitutional:      Appearance: She is well-developed.  HENT:     Right Ear: Ear canal and external ear normal. Tympanic membrane is retracted. Tympanic membrane is not erythematous.     Left Ear: Ear canal and external ear normal. Tympanic membrane is retracted. Tympanic membrane is not erythematous.     Nose:     Right Sinus: Maxillary sinus tenderness present. No frontal sinus tenderness.     Left Sinus: Maxillary sinus tenderness present. No frontal sinus tenderness.     Mouth/Throat:     Mouth: No oral lesions.  Cardiovascular:     Rate and Rhythm: Normal rate and regular  rhythm.     Heart sounds: Normal heart sounds.  Pulmonary:     Effort: Pulmonary effort is normal.     Breath sounds: Normal breath sounds. No wheezing or rales.  Musculoskeletal:     Cervical back: Normal range of motion.  Lymphadenopathy:     Cervical: No cervical adenopathy.  Neurological:     Mental Status: She is alert and oriented to person, place, and time.     Wt Readings from Last 3 Encounters:  12/01/19 204 lb (92.5 kg)  09/12/19 206 lb (93.4 kg)  07/07/19 199 lb (90.3 kg)    BP 118/70   Pulse 96   Temp 99.1 F (37.3 C) (Oral)   Ht 5' 8"  (1.727 m)   Wt 204 lb (92.5 kg)   SpO2 96%   BMI 31.02 kg/m   Assessment and Plan: 1. Acute non-recurrent maxillary sinusitis Continue sudafed bid and Flonase nasal spray daily - predniSONE (DELTASONE) 10 MG tablet; Take 1 tablet (10 mg total) by mouth as directed for 6 days. Take 6,5,4,3,2,1 then stop  Dispense: 21 tablet; Refill: 0 - doxycycline (VIBRA-TABS) 100 MG tablet; Take 1 tablet (100 mg total) by mouth 2 (two) times daily for 10 days.  Dispense: 20 tablet; Refill: 0   Partially dictated using Editor, commissioning. Any errors are unintentional.  Halina Maidens, MD [facility]  12/01/2019

## 2019-12-09 ENCOUNTER — Other Ambulatory Visit: Payer: Self-pay

## 2019-12-09 MED ORDER — SULFAMETHOXAZOLE-TRIMETHOPRIM 800-160 MG PO TABS: 1.0000 | ORAL_TABLET | Freq: Two times a day (BID) | ORAL | 0 refills | Status: AC

## 2019-12-09 NOTE — Progress Notes (Signed)
Pt called saying she is not any better while taking doxycycline and wants to try a new abx.  Sent in Bactrim per Dr Army Melia. Pt will callback after completed if no better.

## 2019-12-10 ENCOUNTER — Encounter: Payer: Self-pay | Admitting: Internal Medicine

## 2019-12-10 ENCOUNTER — Other Ambulatory Visit: Payer: Self-pay

## 2019-12-11 ENCOUNTER — Ambulatory Visit: Payer: BC Managed Care – PPO | Attending: Internal Medicine

## 2019-12-11 DIAGNOSIS — Z20822 Contact with and (suspected) exposure to covid-19: Secondary | ICD-10-CM

## 2019-12-12 LAB — NOVEL CORONAVIRUS, NAA: SARS-CoV-2, NAA: NOT DETECTED

## 2019-12-15 ENCOUNTER — Other Ambulatory Visit: Payer: Self-pay | Admitting: Internal Medicine

## 2019-12-15 ENCOUNTER — Telehealth: Payer: Self-pay

## 2019-12-15 DIAGNOSIS — J0100 Acute maxillary sinusitis, unspecified: Secondary | ICD-10-CM

## 2019-12-15 MED ORDER — LEVOFLOXACIN 500 MG PO TABS: 500.0000 mg | ORAL_TABLET | Freq: Every day | ORAL | 0 refills | Status: DC

## 2019-12-15 NOTE — Telephone Encounter (Signed)
Patient will try Levaquin for 5 days. Was tested for Covid Thursday and the test came back negative. Will call if no better so we can send to ENT.   She said she is still having sinus pressure and pains. Drainage. No better.

## 2019-12-15 NOTE — Telephone Encounter (Signed)
She has had 14 days of antibiotics. If she is not feeling any better then she probably needs to see ENT if she has one. I will send Levaquin x 5 days. She should stop the Bactrim.

## 2019-12-15 NOTE — Telephone Encounter (Signed)
Pt called leaving a VM saying she is still not feeling any better. She is on her second dose of abx and now has a rash breaking out on her chest but otherwise the medication is not helping with her symptoms.  She mentioned Levaquin has worked well for her in the past. She has 4-5 days left of this 2nd antibiotic.  Please advise.

## 2020-01-31 ENCOUNTER — Other Ambulatory Visit: Payer: Self-pay

## 2020-01-31 ENCOUNTER — Ambulatory Visit: Payer: BC Managed Care – PPO | Attending: Internal Medicine

## 2020-01-31 DIAGNOSIS — Z23 Encounter for immunization: Secondary | ICD-10-CM

## 2020-01-31 NOTE — Progress Notes (Signed)
   Covid-19 Vaccination Clinic  Name:  Jilliana Burkes    MRN: [id number] DOB: 29-Oct-1971  01/31/2020  Ms. Camire was observed post Covid-19 immunization for 15 minutes without incident. She was provided with Vaccine Information Sheet and instruction to access the V-Safe system.   Ms. Ryland was instructed to call 911 with any severe reactions post vaccine: Marland Kitchen Difficulty breathing  . Swelling of face and throat  . A fast heartbeat  . A bad rash all over body  . Dizziness and weakness   Immunizations Administered    Name Date Dose VIS Date Route   Pfizer COVID-19 Vaccine 01/31/2020  8:23 AM 0.3 mL 10/31/2019 Intramuscular   Manufacturer: [place]   Lot: [id number]   [place]: [id number]

## 2020-02-24 ENCOUNTER — Ambulatory Visit: Payer: BC Managed Care – PPO | Attending: Internal Medicine

## 2020-02-24 DIAGNOSIS — Z23 Encounter for immunization: Secondary | ICD-10-CM

## 2020-02-24 NOTE — Progress Notes (Signed)
   Covid-19 Vaccination Clinic  Name:  Lauren Cooke    MRN: [id number] DOB: 1970-12-07  02/24/2020  [name] was observed post Covid-19 immunization for 15 minutes without incident. She was provided with Vaccine Information Sheet and instruction to access the V-Safe system.   [name] was instructed to call 911 with any severe reactions post vaccine: Marland Kitchen Difficulty breathing  . Swelling of face and throat  . A fast heartbeat  . A bad rash all over body  . Dizziness and weakness   Immunizations Administered    Name Date Dose VIS Date Route   Pfizer COVID-19 Vaccine 02/24/2020 10:45 AM 0.3 mL 10/31/2019 Intramuscular   Manufacturer: [place]   Lot: [id number]   [place]: [id number]

## 2020-03-04 ENCOUNTER — Other Ambulatory Visit: Payer: Self-pay

## 2020-03-04 ENCOUNTER — Ambulatory Visit: Payer: BC Managed Care – PPO | Admitting: Internal Medicine

## 2020-03-04 ENCOUNTER — Encounter: Payer: Self-pay | Admitting: Internal Medicine

## 2020-03-04 VITALS — BP 112/78 | HR 91 | Temp 98.7°F | Ht 68.0 in | Wt 205.0 lb

## 2020-03-04 DIAGNOSIS — H00024 Hordeolum internum left upper eyelid: Secondary | ICD-10-CM

## 2020-03-04 MED ORDER — LEVOFLOXACIN 500 MG PO TABS: 500.0000 mg | ORAL_TABLET | Freq: Every day | ORAL | 0 refills | Status: AC

## 2020-03-04 MED ORDER — NEOMYCIN-POLYMYXIN-DEXAMETH 3.5-10000-0.1 OP SUSP: 2.0000 [drp] | Freq: Four times a day (QID) | OPHTHALMIC | 0 refills | Status: DC

## 2020-03-04 NOTE — Progress Notes (Signed)
Date:  03/04/2020   Name:  Lauren Cooke   DOB:  [birth year]   MRN:  [id number]   Chief Complaint: Stye (Left eye. Right eye had two bumps on it and now is better but now the left eye is swollen and aggervated. X 1 week. )  Eye Problem  Both eyes are affected.This is a new problem. The current episode started in the past 7 days. Episode frequency: started on the right eye then today on the left. The problem has been rapidly worsening. There was no injury mechanism. The pain is mild. There is no known exposure to pink eye. Associated symptoms include a foreign body sensation and itching. Pertinent negatives include no blurred vision, eye discharge, fever, photophobia or recent URI. She has tried commercial eye wash for the symptoms.    Lab Results  Component Value Date   CREATININE 0.82 09/12/2019   BUN 13 09/12/2019   NA 141 09/12/2019   K 4.2 09/12/2019   CL 102 09/12/2019   CO2 23 09/12/2019   Lab Results  Component Value Date   CHOL 194 09/12/2019   HDL 59 09/12/2019   LDLCALC 121 (H) 09/12/2019   TRIG 74 09/12/2019   CHOLHDL 3.3 09/12/2019   Lab Results  Component Value Date   TSH 1.240 09/12/2019   No results found for: HGBA1C Lab Results  Component Value Date   WBC 6.1 09/12/2019   HGB 12.3 09/12/2019   HCT 36.8 09/12/2019   MCV 87 09/12/2019   PLT 282 09/12/2019   Lab Results  Component Value Date   ALT 11 09/12/2019   AST 12 09/12/2019   ALKPHOS 92 09/12/2019   BILITOT 0.2 09/12/2019     Review of Systems  Constitutional: Negative for chills, fatigue and fever.  HENT: Negative for congestion, sinus pressure and sore throat.   Eyes: Positive for itching. Negative for blurred vision, photophobia, discharge and visual disturbance.  Respiratory: Positive for chest tightness and shortness of breath.     Patient Active Problem List   Diagnosis Date Noted  . Enlarged thyroid gland 03/09/2016  . Chronic constipation 05/27/2015  . Anxiety, generalized  05/27/2015  . 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 L-S radiculopathy 05/27/2015    Allergies  Allergen Reactions  . Clindamycin Other (See Comments) and Anaphylaxis  . Cefuroxime Swelling  . Erythromycin Rash  . Penicillins Rash    Past Surgical History:  Procedure Laterality Date  . BREAST BIOPSY Bilateral 2011   benign-needle bx  . BREAST CYST ASPIRATION Right    negative 2011  . BREAST CYST ASPIRATION Left   . DILITATION & CURRETTAGE/HYSTROSCOPY WITH ESSURE    . ECTOPIC PREGNANCY SURGERY    . OVARIAN CYST REMOVAL    . REFRACTIVE SURGERY      Social History   Tobacco Use  . Smoking status: Former Smoker    Quit date: 11/20/1996    Years since quitting: 23.3  . Smokeless tobacco: Former Network engineer Use Topics  . Alcohol use: Yes    Alcohol/week: 0.0 standard drinks    Comment: occasional  . Drug use: No     Medication list has been reviewed and updated.  Current Meds  Medication Sig  . clonazePAM (KLONOPIN) 0.5 MG tablet Take 1 tablet by mouth as needed.  . sertraline (ZOLOFT) 100 MG tablet Take 150 mg by mouth daily.   . vitamin E (VITAMIN E) 400 UNIT capsule Take 400 Units by mouth  daily.  . zolpidem (AMBIEN) 10 MG tablet Take 1 tablet by mouth as needed.     PHQ 2/9 Scores 03/04/2020 12/01/2019 09/12/2019 07/07/2019  PHQ - 2 Score 0 0 0 0  PHQ- 9 Score 0 - - -    BP Readings from Last 3 Encounters:  03/04/20 112/78  12/01/19 118/70  09/12/19 122/78    Physical Exam Vitals and nursing note reviewed.  Constitutional:      General: She is not in acute distress.    Appearance: She is well-developed.  HENT:     Head: Normocephalic and atraumatic.  Eyes:     Extraocular Movements: Extraocular movements intact.     Conjunctiva/sclera: Conjunctivae normal.     Comments: Swelling and redness of left upper lid Small lesion right lower outer lid  Cardiovascular:     Rate and Rhythm: Normal rate and regular rhythm.    Pulmonary:     Effort: Pulmonary effort is normal. No respiratory distress.     Breath sounds: No wheezing or rhonchi.  Musculoskeletal:     Cervical back: Normal range of motion.  Skin:    General: Skin is warm and dry.     Findings: No rash.  Neurological:     Mental Status: She is alert and oriented to person, place, and time.  Psychiatric:        Behavior: Behavior normal.        Thought Content: Thought content normal.     Wt Readings from Last 3 Encounters:  03/04/20 205 lb (93 kg)  12/01/19 204 lb (92.5 kg)  09/12/19 206 lb (93.4 kg)    BP 112/78   Pulse 91   Temp 98.7 F (37.1 C) (Oral)   Ht 5' 8"  (1.727 m)   Wt 205 lb (93 kg)   LMP 02/16/2020 (Exact Date)   SpO2 97%   BMI 31.17 kg/m   Assessment and Plan: 1. Hordeolum internum left upper eyelid Continue warm compresses Will treat with oral antibiotics and drops - neomycin-polymyxin b-dexamethasone (MAXITROL) 3.5-10000-0.1 SUSP; Place 2 drops into both eyes every 6 (six) hours for 10 days.  Dispense: 5 mL; Refill: 0 - levofloxacin (LEVAQUIN) 500 MG tablet; Take 1 tablet (500 mg total) by mouth daily for 5 days.  Dispense: 5 tablet; Refill: 0   Partially dictated using Editor, commissioning. Any errors are unintentional.  Halina Maidens, MD [facility]  03/04/2020

## 2020-08-13 IMAGING — MG MM DIGITAL SCREENING BILAT W/ TOMO W/ CAD
8 series · 8 of 24 positions shown · non-contrast
Comparison: Previous exam(s).

CLINICAL DATA: Screening.

EXAM:
DIGITAL SCREENING BILATERAL MAMMOGRAM WITH TOMO AND CAD

[L CC synth-2D]
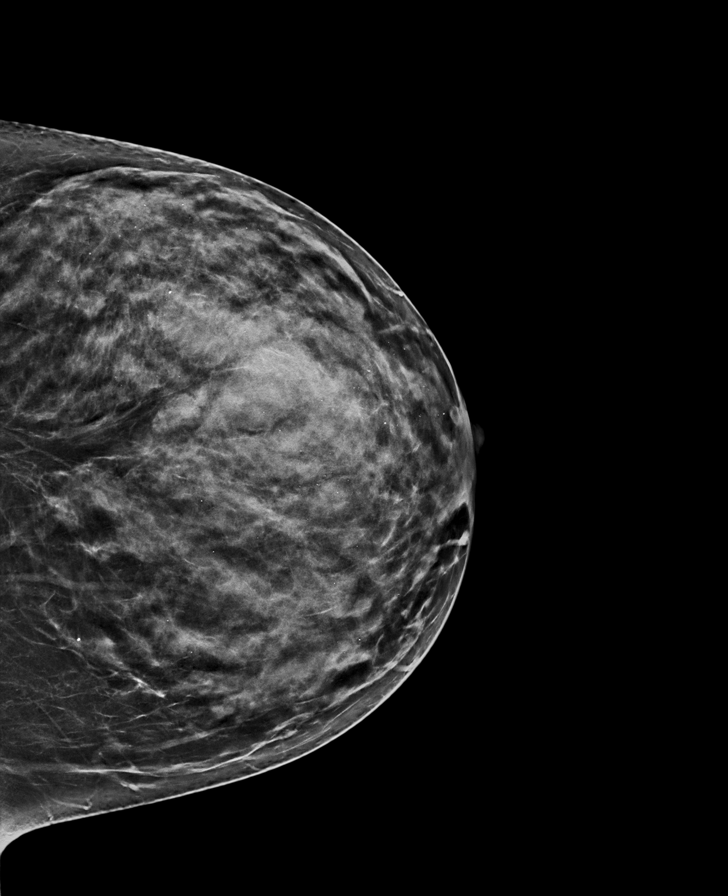

[R MLO synth-2D]
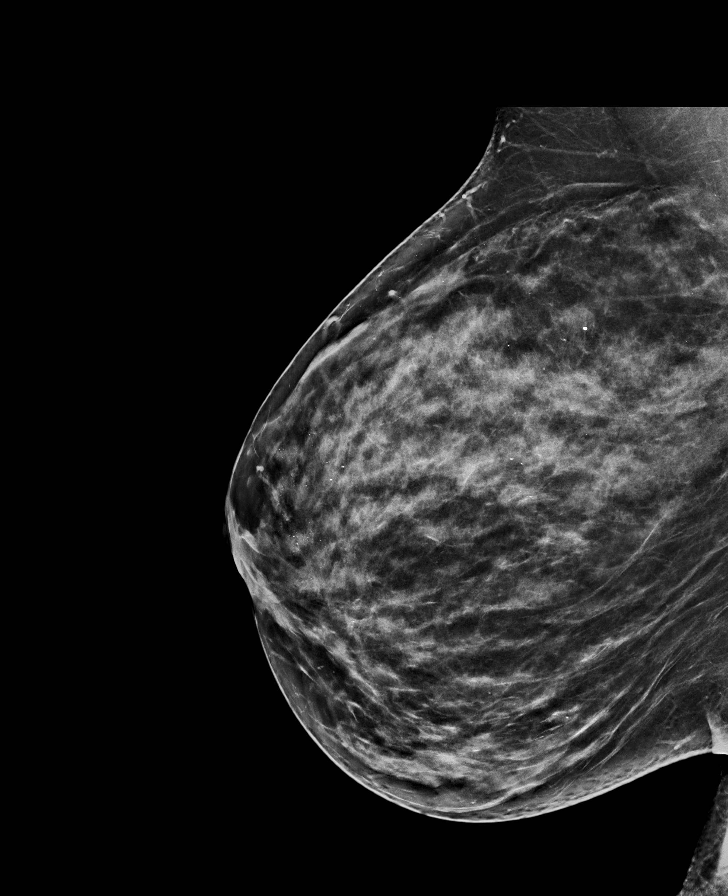

[R CC synth-2D]
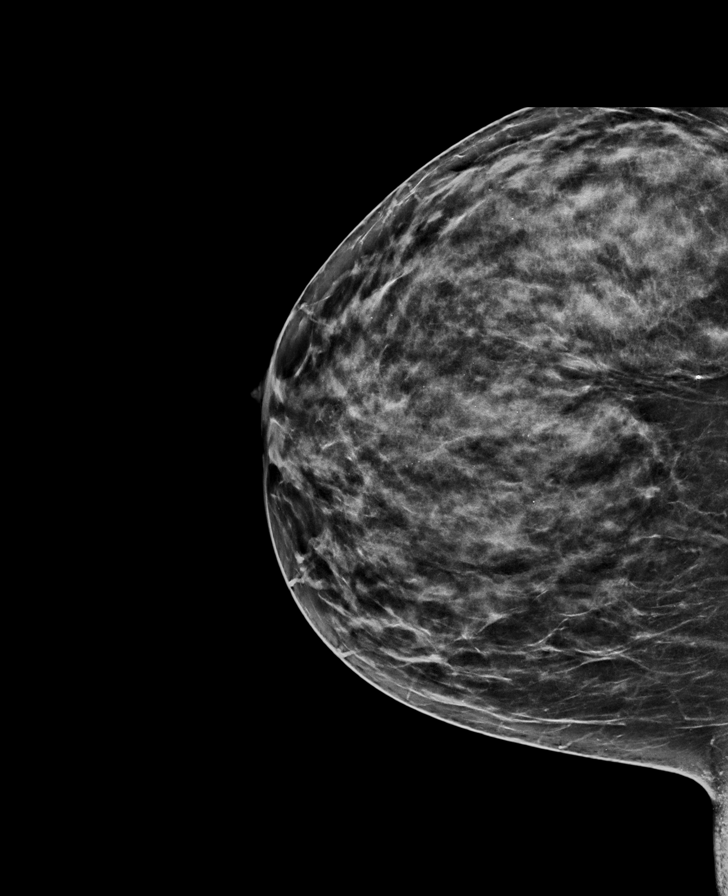

[L MLO synth-2D]
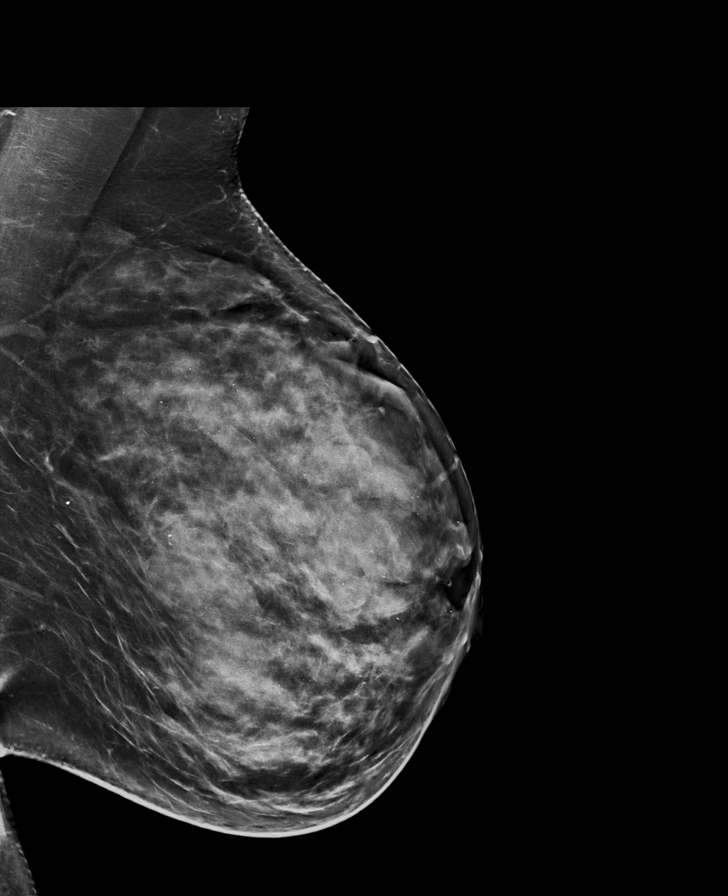

[L CC tomo · tomo slice 36/71.0]
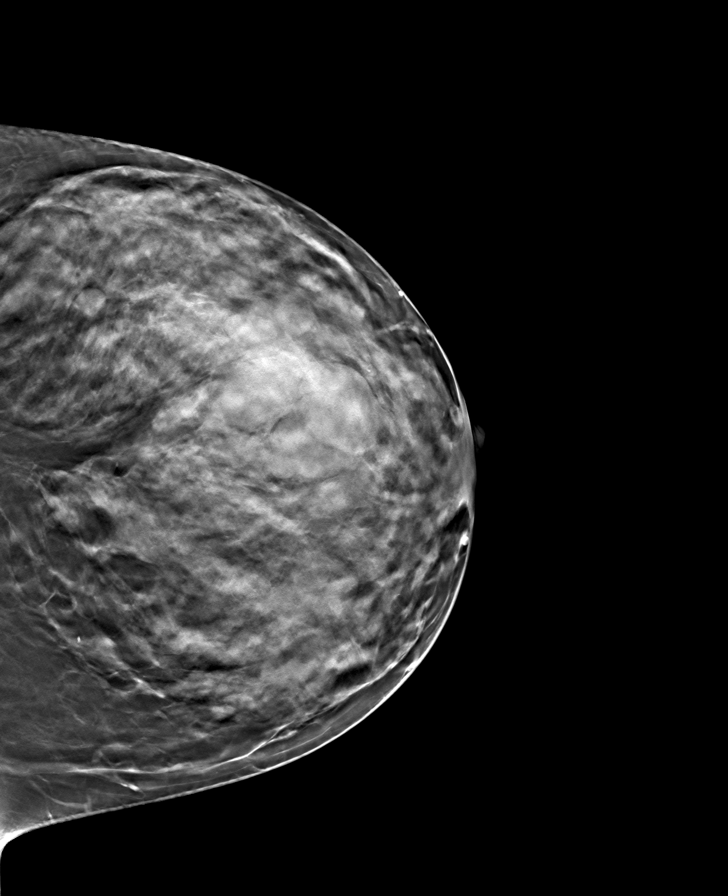

[L MLO tomo · tomo slice 43/86.0]
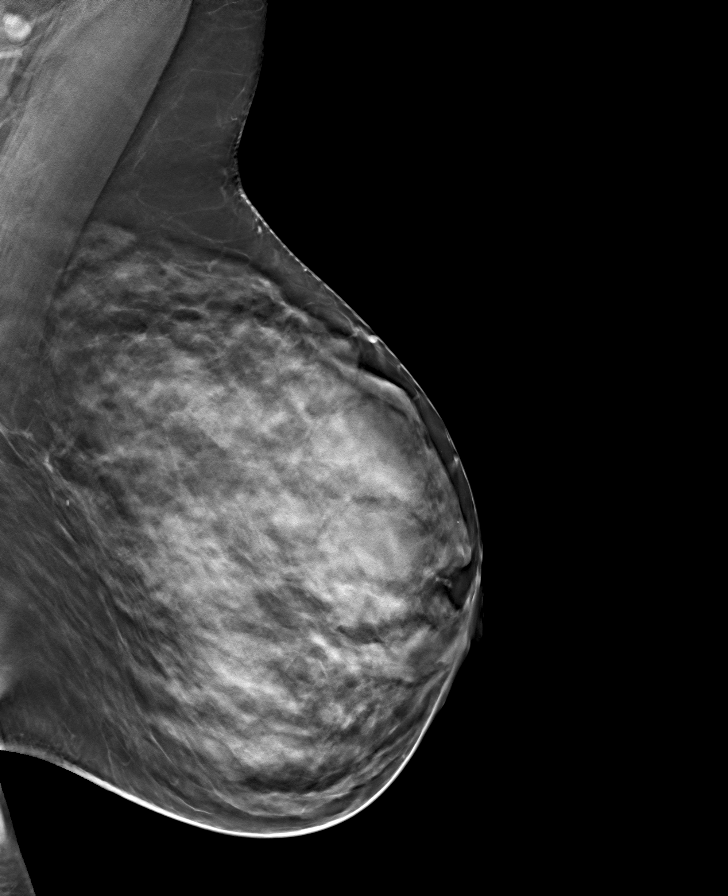

[R CC tomo · tomo slice 37/73.0]
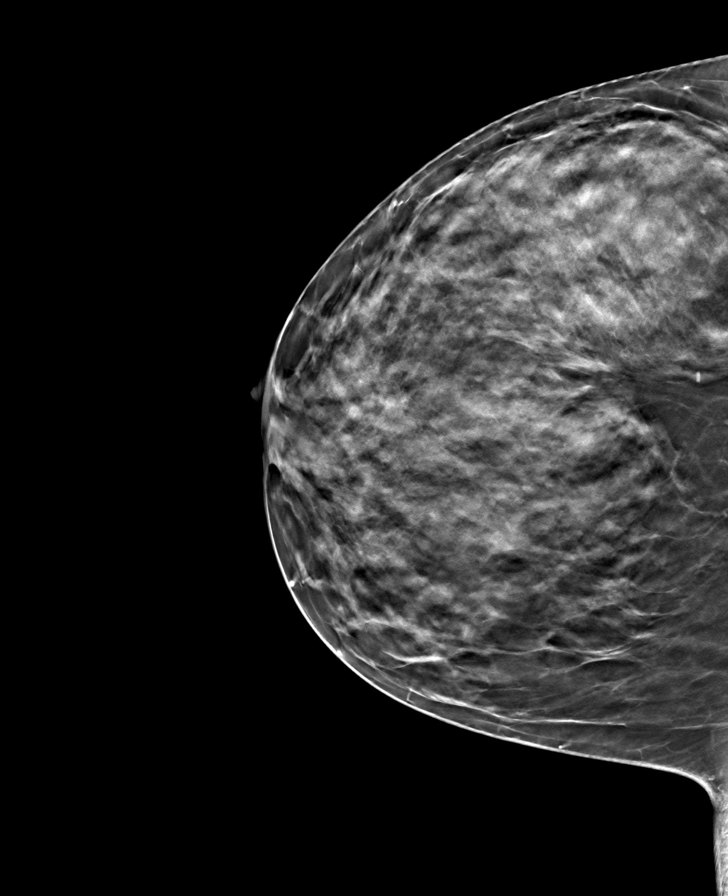

[R MLO tomo · tomo slice 38/75.0]
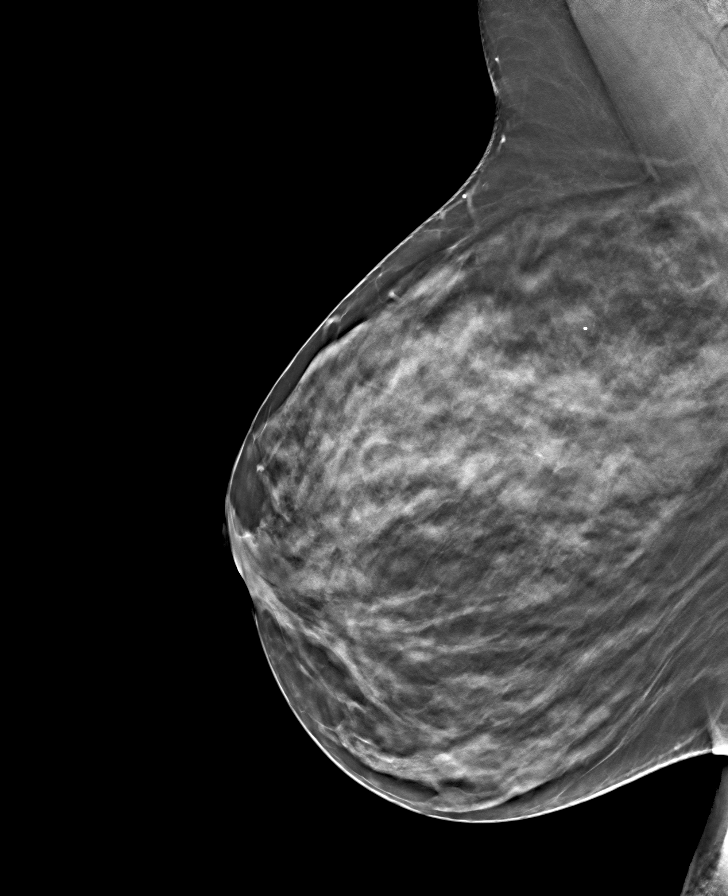

[8 of 24 positions shown; findings below may reference images not displayed]

ACR Breast Density Category c: The breast tissue is heterogeneously
dense, which may obscure small masses.
FINDINGS: There are no findings suspicious for malignancy. Images were
processed with CAD.
IMPRESSION: No mammographic evidence of malignancy. A result letter of this
screening mammogram will be mailed directly to the patient.

RECOMMENDATION:
Screening mammogram in one year. (Code:[id number])

BI-RADS CATEGORY  1: Negative.

## 2020-08-23 ENCOUNTER — Telehealth: Payer: Self-pay | Admitting: Internal Medicine

## 2020-08-23 ENCOUNTER — Other Ambulatory Visit: Payer: Self-pay

## 2020-08-23 DIAGNOSIS — H00024 Hordeolum internum left upper eyelid: Secondary | ICD-10-CM

## 2020-08-23 DIAGNOSIS — J0100 Acute maxillary sinusitis, unspecified: Secondary | ICD-10-CM

## 2020-08-23 MED ORDER — LEVOFLOXACIN 500 MG PO TABS: 500.0000 mg | ORAL_TABLET | Freq: Every day | ORAL | 0 refills | Status: AC

## 2020-08-23 MED ORDER — NEOMYCIN-POLYMYXIN-DEXAMETH 3.5-10000-0.1 OP SUSP: 2.0000 [drp] | Freq: Four times a day (QID) | OPHTHALMIC | 0 refills | Status: AC

## 2020-08-23 NOTE — Telephone Encounter (Addendum)
Pt states she has another stye, just like she had back in April. Pt requesting refill of neomycin-polymyxin b-dexamethasone (MAXITROL) 3.5-10000-0.1 SUSP  [id number] [place] [address] [place]  [phone] Fax:  [phone]    Pt declined appt at this time, states she doesn't see any need to come in office if dr BCarolin Coywill refill. She says it is exactly like it was last time, eye is swollen and hurts.

## 2020-09-13 NOTE — Progress Notes (Signed)
Date:  09/14/2020   Name:  Lauren Cooke   DOB:  1971/08/02   MRN:  [id number]   Chief Complaint: Annual Exam (breast exam no pap)  Lauren Cooke is a 49 y.o. female who presents today for her Complete Annual Exam. She feels well. She reports exercising none. She reports she is sleeping fairly well on Ambien. Breast complaints none.  Mammogram: 06/2019 DEXA: none Pap smear: 02/2016 neg with cotesting Colonoscopy: none  Immunization History  Administered Date(s) Administered  . Influenza-Unspecified 09/11/2019  . PFIZER SARS-COV-2 Vaccination 01/31/2020, 02/24/2020    Anxiety Presents for follow-up (followed by Dr. Nicolasa Ducking) visit. Symptoms include nervous/anxious behavior (intermittent). Patient reports no chest pain, dizziness, palpitations or shortness of breath. Symptoms occur rarely. The severity of symptoms is mild.   Compliance with medications is 76-100%.    Lab Results  Component Value Date   CREATININE 0.82 09/12/2019   BUN 13 09/12/2019   NA 141 09/12/2019   K 4.2 09/12/2019   CL 102 09/12/2019   CO2 23 09/12/2019   Lab Results  Component Value Date   CHOL 194 09/12/2019   HDL 59 09/12/2019   LDLCALC 121 (H) 09/12/2019   TRIG 74 09/12/2019   CHOLHDL 3.3 09/12/2019   Lab Results  Component Value Date   TSH 1.240 09/12/2019   No results found for: HGBA1C Lab Results  Component Value Date   WBC 6.1 09/12/2019   HGB 12.3 09/12/2019   HCT 36.8 09/12/2019   MCV 87 09/12/2019   PLT 282 09/12/2019   Lab Results  Component Value Date   ALT 11 09/12/2019   AST 12 09/12/2019   ALKPHOS 92 09/12/2019   BILITOT 0.2 09/12/2019     Review of Systems  Constitutional: Negative for chills, fatigue and fever.  HENT: Negative for congestion, hearing loss, tinnitus, trouble swallowing and voice change.   Eyes: Positive for redness (recurrent stye on right eye). Negative for visual disturbance.  Respiratory: Negative for cough, chest tightness, shortness of  breath and wheezing.   Cardiovascular: Negative for chest pain, palpitations and leg swelling.  Gastrointestinal: Negative for abdominal pain, constipation, diarrhea and vomiting.  Endocrine: Negative for polydipsia and polyuria.  Genitourinary: Negative for dysuria, frequency, genital sores, vaginal bleeding and vaginal discharge.  Musculoskeletal: Negative for arthralgias, gait problem and joint swelling.  Skin: Negative for color change and rash.  Neurological: Positive for headaches. Negative for dizziness, tremors and light-headedness.  Hematological: Negative for adenopathy. Does not bruise/bleed easily.  Psychiatric/Behavioral: Positive for sleep disturbance. Negative for dysphoric mood. The patient is nervous/anxious (intermittent).     Patient Active Problem List   Diagnosis Date Noted  . Enlarged thyroid gland 03/09/2016  . Chronic constipation 05/27/2015  . Anxiety, generalized 05/27/2015  . 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 L-S radiculopathy 05/27/2015    Allergies  Allergen Reactions  . Cefuroxime Swelling  . Clindamycin Other (See Comments) and Anaphylaxis  . Erythromycin Rash  . Penicillins Rash  . Septra [Sulfamethoxazole-Trimethoprim] Diarrhea    Past Surgical History:  Procedure Laterality Date  . BREAST BIOPSY Bilateral 2011   benign-needle bx  . BREAST CYST ASPIRATION Right    negative 2011  . BREAST CYST ASPIRATION Left   . DILITATION & CURRETTAGE/HYSTROSCOPY WITH ESSURE    . ECTOPIC PREGNANCY SURGERY    . OVARIAN CYST REMOVAL    . REFRACTIVE SURGERY      Social History   Tobacco Use  . Smoking  status: Former Smoker    Quit date: 11/20/1996    Years since quitting: 23.8  . Smokeless tobacco: Former Network engineer Use Topics  . Alcohol use: Yes    Alcohol/week: 0.0 standard drinks    Comment: occasional  . Drug use: No     Medication list has been reviewed and updated.  Current Meds  Medication Sig  .  Cholecalciferol (VITAMIN D3 PO) Take by mouth daily.  . clonazePAM (KLONOPIN) 0.5 MG tablet Take 1 tablet by mouth as needed.  . sertraline (ZOLOFT) 100 MG tablet Take 100 mg by mouth daily.   . vitamin E (VITAMIN E) 400 UNIT capsule Take 400 Units by mouth daily.  Marland Kitchen zolpidem (AMBIEN) 10 MG tablet Take 1 tablet by mouth as needed.     PHQ 2/9 Scores 09/14/2020 03/04/2020 12/01/2019 09/12/2019  PHQ - 2 Score 0 0 0 0  PHQ- 9 Score 0 0 - -    GAD 7 : Generalized Anxiety Score 09/14/2020 03/04/2020  Nervous, Anxious, on Edge 0 0  Control/stop worrying 0 0  Worry too much - different things 0 0  Trouble relaxing 0 0  Restless 0 0  Easily annoyed or irritable 0 0  Afraid - awful might happen 0 0  Total GAD 7 Score 0 0  Anxiety Difficulty Not difficult at all Not difficult at all    BP Readings from Last 3 Encounters:  09/14/20 122/74  03/04/20 112/78  12/01/19 118/70    Physical Exam Vitals and nursing note reviewed.  Constitutional:      General: She is not in acute distress.    Appearance: She is well-developed.  HENT:     Head: Normocephalic and atraumatic.     Right Ear: Tympanic membrane and ear canal normal.     Left Ear: Tympanic membrane and ear canal normal.     Nose:     Right Sinus: No maxillary sinus tenderness.     Left Sinus: No maxillary sinus tenderness.  Eyes:     General: No scleral icterus.       Right eye: Hordeolum (mid upper lid) present. No discharge.        Left eye: No discharge.     Conjunctiva/sclera: Conjunctivae normal.  Neck:     Thyroid: No thyromegaly.     Vascular: No carotid bruit.  Cardiovascular:     Rate and Rhythm: Normal rate and regular rhythm.     Pulses: Normal pulses.     Heart sounds: Normal heart sounds.  Pulmonary:     Effort: Pulmonary effort is normal. No respiratory distress.     Breath sounds: No wheezing.  Chest:     Breasts:        Right: No mass, nipple discharge, skin change or tenderness.        Left: No mass,  nipple discharge, skin change or tenderness.  Abdominal:     General: Bowel sounds are normal.     Palpations: Abdomen is soft.     Tenderness: There is no abdominal tenderness.  Musculoskeletal:     Cervical back: Normal range of motion. No erythema.     Right lower leg: No edema.     Left lower leg: No edema.  Lymphadenopathy:     Cervical: No cervical adenopathy.  Skin:    General: Skin is warm and dry.     Capillary Refill: Capillary refill takes less than 2 seconds.     Findings: No rash.  Neurological:  General: No focal deficit present.     Mental Status: She is alert and oriented to person, place, and time.     Cranial Nerves: No cranial nerve deficit.     Sensory: No sensory deficit.     Deep Tendon Reflexes: Reflexes are normal and symmetric.  Psychiatric:        Attention and Perception: Attention normal.        Mood and Affect: Mood normal.     Wt Readings from Last 3 Encounters:  09/14/20 210 lb (95.3 kg)  03/04/20 205 lb (93 kg)  12/01/19 204 lb (92.5 kg)    BP 122/74 (BP Location: Right Arm, Patient Position: Sitting)   Pulse (!) 103   Temp 98.6 F (37 C) (Oral)   Ht 5' 8"  (1.727 m)   Wt 210 lb (95.3 kg)   LMP 09/07/2020   SpO2 97%   BMI 31.93 kg/m   Assessment and Plan: 1. Annual physical exam Exam is normal except for weight. Encourage regular exercise and appropriate dietary changes. - CBC with Differential/Platelet - Comprehensive metabolic panel - Lipid panel - TSH - POCT urinalysis dipstick  2. Encounter for screening mammogram for breast cancer Schedule at [place]; Future  3. Hordeolum externum of right upper eyelid Recommend continuing antibiotic drops and warm compresses See Ophthalmology if no resolution  4. Anxiety, generalized Doing well on Zoloft and PRN xanax Followed by Dr. Nicolasa Ducking  5. Insomnia disorder related to known organic factor On Ambien from Dr. Nicolasa Ducking No evidence of misuse or adverse  side effects   Partially dictated using Editor, commissioning. Any errors are unintentional.  Halina Maidens, MD [facility]  09/14/2020

## 2020-09-14 ENCOUNTER — Encounter: Payer: Self-pay | Admitting: Internal Medicine

## 2020-09-14 ENCOUNTER — Other Ambulatory Visit: Payer: Self-pay

## 2020-09-14 ENCOUNTER — Ambulatory Visit (INDEPENDENT_AMBULATORY_CARE_PROVIDER_SITE_OTHER): Payer: BC Managed Care – PPO | Admitting: Internal Medicine

## 2020-09-14 VITALS — BP 122/74 | HR 103 | Temp 98.6°F | Ht 68.0 in | Wt 210.0 lb

## 2020-09-14 DIAGNOSIS — H00011 Hordeolum externum right upper eyelid: Secondary | ICD-10-CM

## 2020-09-14 DIAGNOSIS — G4700 Insomnia, unspecified: Secondary | ICD-10-CM

## 2020-09-14 DIAGNOSIS — Z0000 Encounter for general adult medical examination without abnormal findings: Secondary | ICD-10-CM | POA: Diagnosis not present

## 2020-09-14 DIAGNOSIS — F411 Generalized anxiety disorder: Secondary | ICD-10-CM | POA: Diagnosis not present

## 2020-09-14 DIAGNOSIS — Z1231 Encounter for screening mammogram for malignant neoplasm of breast: Secondary | ICD-10-CM

## 2020-09-14 LAB — POCT URINALYSIS DIPSTICK
Bilirubin, UA: NEGATIVE
Blood, UA: NEGATIVE
Glucose, UA: NEGATIVE
Ketones, UA: NEGATIVE
Leukocytes, UA: NEGATIVE
Nitrite, UA: NEGATIVE
Protein, UA: NEGATIVE
Spec Grav, UA: 1.015 (ref 1.010–1.025)
Urobilinogen, UA: 0.2 E.U./dL
pH, UA: 6 (ref 5.0–8.0)

## 2020-09-15 LAB — LIPID PANEL
Chol/HDL Ratio: 4.1 ratio (ref 0.0–4.4)
Cholesterol, Total: 235 mg/dL — ABNORMAL HIGH (ref 100–199)
HDL: 57 mg/dL (ref >39)
LDL Chol Calc (NIH): 161 mg/dL — ABNORMAL HIGH (ref 0–99)
Triglycerides: 94 mg/dL (ref 0–149)
VLDL Cholesterol Cal: 17 mg/dL (ref 5–40)

## 2020-09-15 LAB — CBC WITH DIFFERENTIAL/PLATELET
Basophils Absolute: 0 10*3/uL (ref 0.0–0.2)
Basos: 1 %
EOS (ABSOLUTE): 0.1 10*3/uL (ref 0.0–0.4)
Eos: 2 %
Hematocrit: 36.6 % (ref 34.0–46.6)
Hemoglobin: 12.3 g/dL (ref 11.1–15.9)
Immature Grans (Abs): 0 10*3/uL (ref 0.0–0.1)
Immature Granulocytes: 0 %
Lymphocytes Absolute: 1.6 10*3/uL (ref 0.7–3.1)
Lymphs: 30 %
MCH: 29.9 pg (ref 26.6–33.0)
MCHC: 33.6 g/dL (ref 31.5–35.7)
MCV: 89 fL (ref 79–97)
Monocytes Absolute: 0.5 10*3/uL (ref 0.1–0.9)
Monocytes: 8 %
Neutrophils Absolute: 3.3 10*3/uL (ref 1.4–7.0)
Neutrophils: 59 %
Platelets: 269 10*3/uL (ref 150–450)
RBC: 4.12 x10E6/uL (ref 3.77–5.28)
RDW: 12.8 % (ref 11.7–15.4)
WBC: 5.5 10*3/uL (ref 3.4–10.8)

## 2020-09-15 LAB — COMPREHENSIVE METABOLIC PANEL
ALT: 13 IU/L (ref 0–32)
AST: 13 IU/L (ref 0–40)
Albumin/Globulin Ratio: 1.8 (ref 1.2–2.2)
Albumin: 4.4 g/dL (ref 3.8–4.8)
Alkaline Phosphatase: 90 IU/L (ref 44–121)
BUN/Creatinine Ratio: 17 (ref 9–23)
BUN: 11 mg/dL (ref 6–24)
Bilirubin Total: 0.3 mg/dL (ref 0.0–1.2)
CO2: 22 mmol/L (ref 20–29)
Calcium: 9.2 mg/dL (ref 8.7–10.2)
Chloride: 102 mmol/L (ref 96–106)
Creatinine, Ser: 0.65 mg/dL (ref 0.57–1.00)
GFR calc Af Amer: 121 mL/min/{1.73_m2} (ref >59)
GFR calc non Af Amer: 105 mL/min/{1.73_m2} (ref >59)
Globulin, Total: 2.5 g/dL (ref 1.5–4.5)
Glucose: 103 mg/dL — ABNORMAL HIGH (ref 65–99)
Potassium: 4.3 mmol/L (ref 3.5–5.2)
Sodium: 139 mmol/L (ref 134–144)
Total Protein: 6.9 g/dL (ref 6.0–8.5)

## 2020-09-15 LAB — TSH: TSH: 1.19 u[IU]/mL (ref 0.450–4.500)

## 2020-10-06 ENCOUNTER — Emergency Department: Payer: BC Managed Care – PPO

## 2020-10-06 ENCOUNTER — Other Ambulatory Visit: Payer: Self-pay

## 2020-10-06 ENCOUNTER — Encounter: Payer: Self-pay | Admitting: Emergency Medicine

## 2020-10-06 ENCOUNTER — Emergency Department
Admission: EM | Admit: 2020-10-06 | Discharge: 2020-10-06 | Disposition: A | Discharge status: Home / Self Care | Payer: BC Managed Care – PPO | Attending: Emergency Medicine | Admitting: Emergency Medicine

## 2020-10-06 DIAGNOSIS — Z87891 Personal history of nicotine dependence: Secondary | ICD-10-CM | POA: Insufficient documentation

## 2020-10-06 DIAGNOSIS — R109 Unspecified abdominal pain: Secondary | ICD-10-CM | POA: Insufficient documentation

## 2020-10-06 LAB — URINALYSIS, COMPLETE (UACMP) WITH MICROSCOPIC
Bacteria, UA: NONE SEEN
Bilirubin Urine: NEGATIVE
Glucose, UA: NEGATIVE mg/dL
Ketones, ur: NEGATIVE mg/dL
Leukocytes,Ua: NEGATIVE
Nitrite: NEGATIVE
Protein, ur: NEGATIVE mg/dL
Specific Gravity, Urine: 1.012 (ref 1.005–1.030)
pH: 5 (ref 5.0–8.0)

## 2020-10-06 LAB — COMPREHENSIVE METABOLIC PANEL
ALT: 19 U/L (ref 0–44)
AST: 17 U/L (ref 15–41)
Albumin: 4 g/dL (ref 3.5–5.0)
Alkaline Phosphatase: 76 U/L (ref 38–126)
Anion gap: 9 (ref 5–15)
BUN: 16 mg/dL (ref 6–20)
CO2: 26 mmol/L (ref 22–32)
Calcium: 9.2 mg/dL (ref 8.9–10.3)
Chloride: 103 mmol/L (ref 98–111)
Creatinine, Ser: 0.83 mg/dL (ref 0.44–1.00)
GFR, Estimated: >60 mL/min (ref >60)
Glucose, Bld: 116 mg/dL — ABNORMAL HIGH (ref 70–99)
Potassium: 4.1 mmol/L (ref 3.5–5.1)
Sodium: 138 mmol/L (ref 135–145)
Total Bilirubin: 0.4 mg/dL (ref 0.3–1.2)
Total Protein: 7.4 g/dL (ref 6.5–8.1)

## 2020-10-06 LAB — CBC WITH DIFFERENTIAL/PLATELET
Abs Immature Granulocytes: 0.03 10*3/uL (ref 0.00–0.07)
Basophils Absolute: 0 10*3/uL (ref 0.0–0.1)
Basophils Relative: 1 %
Eosinophils Absolute: 0.2 10*3/uL (ref 0.0–0.5)
Eosinophils Relative: 3 %
HCT: 38.3 % (ref 36.0–46.0)
Hemoglobin: 12.3 g/dL (ref 12.0–15.0)
Immature Granulocytes: 1 %
Lymphocytes Relative: 34 %
Lymphs Abs: 2.1 10*3/uL (ref 0.7–4.0)
MCH: 29.1 pg (ref 26.0–34.0)
MCHC: 32.1 g/dL (ref 30.0–36.0)
MCV: 90.5 fL (ref 80.0–100.0)
Monocytes Absolute: 0.6 10*3/uL (ref 0.1–1.0)
Monocytes Relative: 9 %
Neutro Abs: 3.3 10*3/uL (ref 1.7–7.7)
Neutrophils Relative %: 52 %
Platelets: 319 10*3/uL (ref 150–400)
RBC: 4.23 MIL/uL (ref 3.87–5.11)
RDW: 12.9 % (ref 11.5–15.5)
WBC: 6.2 10*3/uL (ref 4.0–10.5)
nRBC: 0 % (ref 0.0–0.2)

## 2020-10-06 LAB — POC URINE PREG, ED: Preg Test, Ur: NEGATIVE

## 2020-10-06 MED ORDER — KETOROLAC TROMETHAMINE 60 MG/2ML IM SOLN
30.0000 mg | Freq: Once | INTRAMUSCULAR | Status: AC
  Administered 2020-10-06: 30 mg via INTRAMUSCULAR
  Filled 2020-10-06: qty 2

## 2020-10-06 MED ORDER — SODIUM CHLORIDE 0.9 % IV BOLUS
1000.0000 mL | Freq: Once | INTRAVENOUS | Status: AC
  Administered 2020-10-06: 1000 mL via INTRAVENOUS

## 2020-10-06 MED ORDER — ONDANSETRON 4 MG PO TBDP
4.0000 mg | ORAL_TABLET | Freq: Once | ORAL | Status: AC
  Administered 2020-10-06: 4 mg via ORAL
  Filled 2020-10-06: qty 1

## 2020-10-06 MED ORDER — LIDOCAINE 5 % EX PTCH
1.0000 | MEDICATED_PATCH | CUTANEOUS | Status: DC
  Administered 2020-10-06: 1 via TRANSDERMAL
  Filled 2020-10-06: qty 1

## 2020-10-06 MED ORDER — HYDROCODONE-ACETAMINOPHEN 5-325 MG PO TABS: 2.0000 | ORAL_TABLET | Freq: Four times a day (QID) | ORAL | 0 refills | Status: DC | PRN

## 2020-10-06 NOTE — ED Notes (Signed)
MD at bedside. 

## 2020-10-06 NOTE — ED Provider Notes (Signed)
[facility] Emergency Department Provider Note    ____________________________________________   I have reviewed the triage vital signs and the nursing notes.   HISTORY  Chief Complaint Flank Pain   History limited by: Not Limited   HPI Lauren Cooke is a 49 y.o. female who presents to the emergency department today because of concerns for left sided flank pain.  Patient states that the pain woke her up from sleep a few hours ago.  She describes it as sharp and gripping.  It does take her breath away.  She denies any radiation of the pain.  Denies any nausea or vomiting.  Denies similar pain in the past.  Does have a history of back disease but this felt different.  She denies any recent change in her urine.  No fevers.  Denies any recent trauma or unusual activity.  Records reviewed. Per medical record review patient has a history of chronic constipation., lumbar sacral radiculopathy.  History reviewed. No pertinent past medical history.  Patient Active Problem List   Diagnosis Date Noted  . Enlarged thyroid gland 03/09/2016  . Chronic constipation 05/27/2015  . Anxiety, generalized 05/27/2015  . 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 L-S radiculopathy 05/27/2015    Past Surgical History:  Procedure Laterality Date  . BREAST BIOPSY Bilateral 2011   benign-needle bx  . BREAST CYST ASPIRATION Right    negative 2011  . BREAST CYST ASPIRATION Left   . DILITATION & CURRETTAGE/HYSTROSCOPY WITH ESSURE    . ECTOPIC PREGNANCY SURGERY    . OVARIAN CYST REMOVAL    . REFRACTIVE SURGERY      Prior to Admission medications   Medication Sig Start Date End Date Taking? Authorizing Provider  Cholecalciferol (VITAMIN D3 PO) Take by mouth daily.    [provider]  clonazePAM (KLONOPIN) 0.5 MG tablet Take 1 tablet by mouth as needed. 03/11/19   [provider]  sertraline (ZOLOFT) 100 MG tablet Take 100 mg by mouth  daily.  05/09/16   [provider]  vitamin E (VITAMIN E) 400 UNIT capsule Take 400 Units by mouth daily.    [provider]  zolpidem (AMBIEN) 10 MG tablet Take 1 tablet by mouth as needed.     [provider]    Allergies Cefuroxime, Clindamycin, Erythromycin, Penicillins, and Septra [sulfamethoxazole-trimethoprim]  Family History  Problem Relation Age of Onset  . Coronary artery disease Father   . Breast cancer Neg Hx     Social History Social History   Tobacco Use  . Smoking status: Former Smoker    Quit date: 11/20/1996    Years since quitting: 23.8  . Smokeless tobacco: Former Network engineer  . Vaping Use: Never used  Substance Use Topics  . Alcohol use: Yes    Alcohol/week: 0.0 standard drinks    Comment: occasional  . Drug use: No    Review of Systems Constitutional: No fever/chills Eyes: No visual changes. ENT: No sore throat. Cardiovascular: Denies chest pain. Respiratory: Denies shortness of breath. Gastrointestinal: Positive for right flank pain. Genitourinary: Negative for dysuria. Musculoskeletal: Negative for back pain. Skin: Negative for rash. Neurological: Negative for headaches, focal weakness or numbness.  ____________________________________________   PHYSICAL EXAM:  VITAL SIGNS: ED Triage Vitals  Enc Vitals Group     BP 10/06/20 0452 126/79     Pulse Rate 10/06/20 0452 88     Resp 10/06/20 0452 18     Temp 10/06/20 0452  98.2 F (36.8 C)     Temp src --      SpO2 10/06/20 0452 98 %     Weight 10/06/20 0452 210 lb (95.3 kg)     Height 10/06/20 0452 5' 8"  (1.727 m)     Head Circumference --      Peak Flow --      Pain Score 10/06/20 0451 9   Constitutional: Alert and oriented.  Eyes: Conjunctivae are normal.  ENT      Head: Normocephalic and atraumatic.      Nose: No congestion/rhinnorhea.      Mouth/Throat: Mucous membranes are moist.      Neck: No stridor. Hematological/Lymphatic/Immunilogical: No  cervical lymphadenopathy. Cardiovascular: Normal rate, regular rhythm.  No murmurs, rubs, or gallops.  Respiratory: Normal respiratory effort without tachypnea nor retractions. Breath sounds are clear and equal bilaterally. No wheezes/rales/rhonchi. Gastrointestinal: Soft and non tender. No rebound. No guarding.  Genitourinary: Deferred Musculoskeletal: Normal range of motion in all extremities. No lower extremity edema. Neurologic:  Normal speech and language. No gross focal neurologic deficits are appreciated.  Skin:  Skin is warm, dry and intact. No rash noted. Psychiatric: Mood and affect are normal. Speech and behavior are normal. Patient exhibits appropriate insight and judgment.  ____________________________________________    LABS (pertinent positives/negatives)  Upreg negative CMP wnl except glu 116 UA clear, small hgb dipstick, 0-5 rbc and wbc CBC wbc 6.2, hgb 12.3, plt 319  ____________________________________________   EKG  None  ____________________________________________    RADIOLOGY  CT renal No kidney stone. 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   Patient presented to the emergency department today because of concerns for left flank pain.  On exam no rash.  Abdomen is nontender.  Slight discomfort to palpation of area of pain.  Urine did show small amount of hemoglobin.  Patient states she just finished her period.  Given concern for possible kidney stone will obtain CT scan to evaluate.  CT scan without evidence of kidney stone or other acute intraabdominal pathology. Discussed finding with  patient. At this time do think likely patient suffering MSK cause of the pain. Will give toradol to see if that helps with discomfort. Also discussed finding of breast masses. 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   ____________________________________________   FINAL CLINICAL IMPRESSION(S) / ED DIAGNOSES  Final diagnoses:  Left flank pain     Note: This dictation was prepared with Dragon dictation. Any transcriptional errors that result from this process are unintentional     Nance Pear, MD 10/06/20 [id number]

## 2020-10-06 NOTE — Discharge Instructions (Signed)
Please seek medical attention for any high fevers, chest pain, shortness of breath, change in behavior, persistent vomiting, bloody stool or any other new or concerning symptoms.  

## 2020-10-06 NOTE — ED Provider Notes (Signed)
Emergency department handoff note  Care of this patient was signed out to me at the end of the previous provider shift.  All pertinent patient information was conveyed and all questions were answered.  Patient was pending pain control for diagnosed kidney stone prior to discharge.  Patient did have an episode of hypotension after some narcotic medication administration.  Blood pressure significantly improved after IV fluids were administered.  The patient has been reexamined and is ready to be discharged. Patient's pain well controlled and agrees with plan. All diagnostic results have been reviewed and discussed with the patient/family.  Care plan has been outlined and the patient/family understands all current diagnoses, results, and treatment plans.  There are no new complaints, changes, or physical findings at this time.  All questions have been addressed and answered.  All medications, if any, that were given while in the emergency department or any that are being prescribed have been reviewed with the patient/family.  All side effects and adverse reactions have been explained.  Patient was instructed to, and agrees to follow-up with their primary care physician as well as return to the emergency department if any new or worsening symptoms develop.   Naaman Plummer, MD 10/06/20 1003

## 2020-10-06 NOTE — ED Notes (Addendum)
Pain in left flank that worsens with movement. Pt sitting on edge of bed for comfort.

## 2020-10-06 NOTE — ED Triage Notes (Signed)
Patient ambulatory to triage with steady gait, without difficulty or distress noted; pt reports left lower back pain, nonradiating upon awakening this am; denies any accomp symptoms; denies any known injury, denies hx of same

## 2020-11-29 ENCOUNTER — Other Ambulatory Visit: Payer: Self-pay

## 2020-11-29 ENCOUNTER — Ambulatory Visit
Admission: RE | Admit: 2020-11-29 | Discharge: 2020-11-29 | Disposition: A | Discharge status: Home / Self Care | Payer: BC Managed Care – PPO | Source: Ambulatory Visit | Attending: Internal Medicine | Admitting: Internal Medicine

## 2020-11-29 DIAGNOSIS — Z1231 Encounter for screening mammogram for malignant neoplasm of breast: Secondary | ICD-10-CM | POA: Diagnosis not present

## 2021-02-02 ENCOUNTER — Ambulatory Visit: Payer: Self-pay | Admitting: *Deleted

## 2021-02-02 NOTE — Telephone Encounter (Signed)
  Reason for Disposition . Patient sounds very sick or weak to the triager  Answer Assessment - Initial Assessment Questions 1. DESCRIPTION: "Describe your dizziness."     Lightheaded 2. LIGHTHEADED: "Do you feel lightheaded?" (e.g., somewhat faint, woozy, weak upon standing)     lightheaded 3. VERTIGO: "Do you feel like either you or the room is spinning or tilting?" (i.e. vertigo)     no 4. SEVERITY: "How bad is it?"  "Do you feel like you are going to faint?" "Can you stand and walk?"   - MILD: Feels slightly dizzy, but walking normally.   - MODERATE: Feels very unsteady when walking, but not falling; interferes with normal activities (e.g., school, work) .   - SEVERE: Unable to walk without falling, or requires assistance to walk without falling; feels like passing out now.      Moderate to severe 5. ONSET:  "When did the dizziness begin?"    01/30/21 6. AGGRAVATING FACTORS: "Does anything make it worse?" (e.g., standing, change in head position)    When getting up from bed 7. HEART RATE: "Can you tell me your heart rate?" "How many beats in 15 seconds?"  (Note: not all patients can do this)       Pt can not complete this task 8. CAUSE: "What do you think is causing the dizziness?"    Not sure 9. RECURRENT SYMPTOM: "Have you had dizziness before?" If Yes, ask: "When was the last time?" "What happened that time?"     no 10. OTHER SYMPTOMS: "Do you have any other symptoms?" (e.g., fever, chest pain, vomiting, diarrhea, bleeding)       Diarrhea and vomiting 11. PREGNANCY: "Is there any chance you are pregnant?" "When was your last menstrual period?"  Protocols used: DIZZINESS [facility]

## 2021-02-02 NOTE — Telephone Encounter (Signed)
Called to inform patient she said needed to be seen at urgent care or ER she insisted that it could be a stomach bug and that she will try to wing it out, if her symptoms persist she will go to urgent or ER.

## 2021-02-02 NOTE — Telephone Encounter (Addendum)
Pt called with complaints of "nausea and feeling yucky"; the pt says she had 2 episodes of vomiting and diarrhea on 01/30/21; she also says she passed out when she vomited at 0400 3/11/22/20; she ate pork dumplings at 1800 on 01/29/21; she feels dizzy and light headed when she gets up; the pt continues to have on continuous nausea; since then she has eaten toast, chicken noodle soup, applesauce and pedialyte (3 cups total since 3/14, and water and gingerale)the pt says she had a fever in the 100 range and headache on 01/30/21 - 01/31/21; she states her at  [place] test was negative on 02/01/21; the pt says she is urinating; recommendations made per nurse triage protocol; she verbalized understanding and does not want to go to the ED; she would like to be seen by her PCP; the pt sees Dr Army Melia, [facility]; the pt can be contacted at [phone]; will route to office for final disposition.

## 2021-06-07 ENCOUNTER — Ambulatory Visit: Payer: BC Managed Care – PPO | Admitting: Internal Medicine

## 2021-06-07 ENCOUNTER — Encounter: Payer: Self-pay | Admitting: Internal Medicine

## 2021-06-07 ENCOUNTER — Other Ambulatory Visit: Payer: Self-pay

## 2021-06-07 VITALS — BP 118/82 | HR 93 | Temp 98.8°F | Ht 68.0 in | Wt 204.0 lb

## 2021-06-07 DIAGNOSIS — J0100 Acute maxillary sinusitis, unspecified: Secondary | ICD-10-CM | POA: Diagnosis not present

## 2021-06-07 MED ORDER — PREDNISONE 10 MG PO TABS
10.0000 mg | ORAL_TABLET | ORAL | 0 refills | Status: AC
Start: 2021-06-07 — End: 2021-06-13

## 2021-06-07 MED ORDER — LEVOFLOXACIN 500 MG PO TABS
500.0000 mg | ORAL_TABLET | Freq: Every day | ORAL | 0 refills | Status: AC
Start: 2021-06-07 — End: 2021-06-14

## 2021-06-07 NOTE — Progress Notes (Signed)
Date:  06/07/2021   Name:  Lauren Cooke   DOB:  1971-10-14   MRN:  [id number]   Chief Complaint: Sinusitis (X4 weeks ago, pain behind eyes and cheeks, headaches, post nasal drip, cough no mucous, ears crackling, negative coivd test 3 weeks ago )  Sinusitis This is a new problem. The current episode started 1 to 4 weeks ago. The problem is unchanged. There has been no fever. Associated symptoms include congestion, coughing, ear pain, headaches and sinus pressure. Pertinent negatives include no chills, diaphoresis, shortness of breath or sore throat. Past treatments include saline sprays, spray decongestants and oral decongestants. The treatment provided mild relief.   Lab Results  Component Value Date   CREATININE 0.83 10/06/2020   BUN 16 10/06/2020   NA 138 10/06/2020   K 4.1 10/06/2020   CL 103 10/06/2020   CO2 26 10/06/2020   Lab Results  Component Value Date   CHOL 235 (H) 09/14/2020   HDL 57 09/14/2020   LDLCALC 161 (H) 09/14/2020   TRIG 94 09/14/2020   CHOLHDL 4.1 09/14/2020   Lab Results  Component Value Date   TSH 1.190 09/14/2020   No results found for: HGBA1C Lab Results  Component Value Date   WBC 6.2 10/06/2020   HGB 12.3 10/06/2020   HCT 38.3 10/06/2020   MCV 90.5 10/06/2020   PLT 319 10/06/2020   Lab Results  Component Value Date   ALT 19 10/06/2020   AST 17 10/06/2020   ALKPHOS 76 10/06/2020   BILITOT 0.4 10/06/2020     Review of Systems  Constitutional:  Negative for chills, diaphoresis and fever.  HENT:  Positive for congestion, ear pain, postnasal drip and sinus pressure. Negative for sore throat.   Respiratory:  Positive for cough. Negative for chest tightness, shortness of breath and wheezing.   Cardiovascular:  Negative for chest pain and palpitations.  Neurological:  Positive for headaches.  Psychiatric/Behavioral:  Negative for dysphoric mood and sleep disturbance. The patient is not nervous/anxious.    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   L-S radiculopathy 05/27/2015    Allergies  Allergen Reactions   Cefuroxime Swelling   Clindamycin Other (See Comments) and Anaphylaxis   Erythromycin Rash   Penicillins Rash   Septra [Sulfamethoxazole-Trimethoprim] Diarrhea    Past Surgical History:  Procedure Laterality Date   BREAST BIOPSY Bilateral 2011   benign-needle bx   BREAST CYST ASPIRATION Right    negative 2011   BREAST CYST ASPIRATION Left    DILITATION & CURRETTAGE/HYSTROSCOPY WITH ESSURE     ECTOPIC PREGNANCY SURGERY     OVARIAN CYST REMOVAL     REFRACTIVE SURGERY      Social History   Tobacco Use   Smoking status: Former    Types: Cigarettes    Quit date: 11/20/1996    Years since quitting: 24.5   Smokeless tobacco: Former  Scientific laboratory technician Use: Never used  Substance Use Topics   Alcohol use: Yes    Alcohol/week: 0.0 standard drinks    Comment: occasional   Drug use: No     Medication list has been reviewed and updated.  Current Meds  Medication Sig   clonazePAM (KLONOPIN) 0.5 MG tablet Take 1 tablet by mouth as needed.   levofloxacin (LEVAQUIN) 500 MG tablet Take  1 tablet (500 mg total) by mouth daily for 7 days.   predniSONE (DELTASONE) 10 MG tablet Take 1 tablet (10 mg total) by mouth as directed for 6 days. Take 6,5,4,3,2,1 then stop   sertraline (ZOLOFT) 100 MG tablet Take 200 mg by mouth daily.   vitamin E 180 MG (400 UNITS) capsule Take 400 Units by mouth daily.   zolpidem (AMBIEN) 5 MG tablet Take 5 mg by mouth at bedtime as needed.   [DISCONTINUED] Cholecalciferol (VITAMIN D3 PO) Take by mouth daily.   [DISCONTINUED] HYDROcodone-acetaminophen (NORCO) 5-325 MG tablet Take 2 tablets by mouth every 6 (six) hours as needed for moderate pain.    PHQ 2/9 Scores 06/07/2021 09/14/2020 03/04/2020  12/01/2019  PHQ - 2 Score 0 0 0 0  PHQ- 9 Score 0 0 0 -    GAD 7 : Generalized Anxiety Score 06/07/2021 09/14/2020 03/04/2020  Nervous, Anxious, on Edge 1 0 0  Control/stop worrying 1 0 0  Worry too much - different things 0 0 0  Trouble relaxing 0 0 0  Restless 0 0 0  Easily annoyed or irritable 0 0 0  Afraid - awful might happen 0 0 0  Total GAD 7 Score 2 0 0  Anxiety Difficulty - Not difficult at all Not difficult at all    BP Readings from Last 3 Encounters:  06/07/21 118/82  10/06/20 104/73  09/14/20 122/74    Physical Exam Constitutional:      Appearance: She is well-developed.  HENT:     Right Ear: Ear canal and external ear normal. Tympanic membrane is retracted. Tympanic membrane is not erythematous.     Left Ear: Ear canal and external ear normal. Tympanic membrane is retracted. Tympanic membrane is not erythematous.     Nose:     Right Sinus: Maxillary sinus tenderness and frontal sinus tenderness present.     Left Sinus: Maxillary sinus tenderness and frontal sinus tenderness present.     Mouth/Throat:     Mouth: No oral lesions.     Pharynx: Uvula midline. No oropharyngeal exudate or posterior oropharyngeal erythema.  Cardiovascular:     Rate and Rhythm: Normal rate and regular rhythm.     Pulses: Normal pulses.     Heart sounds: Normal heart sounds.  Pulmonary:     Breath sounds: Normal breath sounds. No wheezing, rhonchi or rales.  Lymphadenopathy:     Cervical: No cervical adenopathy.  Skin:    Capillary Refill: Capillary refill takes less than 2 seconds.  Neurological:     Mental Status: She is alert and oriented to person, place, and time.    Wt Readings from Last 3 Encounters:  06/07/21 204 lb (92.5 kg)  10/06/20 210 lb (95.3 kg)  09/14/20 210 lb (95.3 kg)    BP 118/82   Pulse 93   Temp 98.8 F (37.1 C) (Oral)   Ht 5' 8"  (1.727 m)   Wt 204 lb (92.5 kg)   SpO2 97%   BMI 31.02 kg/m   Assessment and Plan: 1. Acute non-recurrent maxillary  sinusitis Continue fluids, Flonase and sudafed daily - levofloxacin (LEVAQUIN) 500 MG tablet; Take 1 tablet (500 mg total) by mouth daily for 7 days.  Dispense: 7 tablet; Refill: 0 - predniSONE (DELTASONE) 10 MG tablet; Take 1 tablet (10 mg total) by mouth as directed for 6 days. Take 6,5,4,3,2,1 then stop  Dispense: 21 tablet; Refill: 0   Partially dictated using Editor, commissioning. Any errors are unintentional.  Halina Maidens,  MD [facility]  06/07/2021

## 2021-09-16 ENCOUNTER — Ambulatory Visit (INDEPENDENT_AMBULATORY_CARE_PROVIDER_SITE_OTHER): Payer: BC Managed Care – PPO | Admitting: Internal Medicine

## 2021-09-16 ENCOUNTER — Encounter: Payer: Self-pay | Admitting: Internal Medicine

## 2021-09-16 ENCOUNTER — Other Ambulatory Visit (HOSPITAL_COMMUNITY)
Admission: RE | Admit: 2021-09-16 | Discharge: 2021-09-16 | Disposition: A | Discharge status: Home / Self Care | Payer: BC Managed Care – PPO | Source: Ambulatory Visit | Attending: Internal Medicine | Admitting: Internal Medicine

## 2021-09-16 ENCOUNTER — Other Ambulatory Visit: Payer: Self-pay

## 2021-09-16 VITALS — BP 128/86 | HR 98 | Ht 68.0 in | Wt 210.0 lb

## 2021-09-16 DIAGNOSIS — Z1159 Encounter for screening for other viral diseases: Secondary | ICD-10-CM | POA: Diagnosis not present

## 2021-09-16 DIAGNOSIS — Z124 Encounter for screening for malignant neoplasm of cervix: Secondary | ICD-10-CM

## 2021-09-16 DIAGNOSIS — E785 Hyperlipidemia, unspecified: Secondary | ICD-10-CM

## 2021-09-16 DIAGNOSIS — Z1211 Encounter for screening for malignant neoplasm of colon: Secondary | ICD-10-CM

## 2021-09-16 DIAGNOSIS — Z0000 Encounter for general adult medical examination without abnormal findings: Secondary | ICD-10-CM

## 2021-09-16 DIAGNOSIS — Z23 Encounter for immunization: Secondary | ICD-10-CM

## 2021-09-16 DIAGNOSIS — Z1231 Encounter for screening mammogram for malignant neoplasm of breast: Secondary | ICD-10-CM

## 2021-09-16 NOTE — Progress Notes (Signed)
Date:  09/16/2021   Name:  Lauren Cooke   DOB:  May 28, 1971   MRN:  [id number]   Chief Complaint: Annual Exam (Breast Exam. Pap smear. ) Lauren Cooke is a 50 y.o. female who presents today for her Complete Annual Exam. She feels well. She reports exercising - some. She reports she is sleeping well. Breast complaints - new cyst in lower left breast- noticed in the last week or two-not painful.  Mammogram: 11/2020 DEXA: none Pap smear: 02/2016 neg with co-testing Colonoscopy: none due soon  Immunization History  Administered Date(s) Administered   Influenza-Unspecified 09/11/2019   PFIZER(Purple Top)SARS-COV-2 Vaccination 01/31/2020, 02/24/2020    HPI  Lab Results  Component Value Date   CREATININE 0.83 10/06/2020   BUN 16 10/06/2020   NA 138 10/06/2020   K 4.1 10/06/2020   CL 103 10/06/2020   CO2 26 10/06/2020   Lab Results  Component Value Date   CHOL 235 (H) 09/14/2020   HDL 57 09/14/2020   LDLCALC 161 (H) 09/14/2020   TRIG 94 09/14/2020   CHOLHDL 4.1 09/14/2020   Lab Results  Component Value Date   TSH 1.190 09/14/2020   No results found for: HGBA1C Lab Results  Component Value Date   WBC 6.2 10/06/2020   HGB 12.3 10/06/2020   HCT 38.3 10/06/2020   MCV 90.5 10/06/2020   PLT 319 10/06/2020   Lab Results  Component Value Date   ALT 19 10/06/2020   AST 17 10/06/2020   ALKPHOS 76 10/06/2020   BILITOT 0.4 10/06/2020     Review of Systems  Constitutional:  Negative for chills, fatigue and fever.  HENT:  Negative for congestion, hearing loss, tinnitus, trouble swallowing and voice change.   Eyes:  Negative for visual disturbance.  Respiratory:  Negative for cough, chest tightness, shortness of breath and wheezing.   Cardiovascular:  Negative for chest pain, palpitations and leg swelling.  Gastrointestinal:  Negative for abdominal pain, constipation, diarrhea and vomiting.  Endocrine: Negative for polydipsia and polyuria.  Genitourinary:  Negative  for dysuria, frequency, genital sores, vaginal bleeding and vaginal discharge.  Musculoskeletal:  Negative for arthralgias, gait problem and joint swelling.  Skin:  Negative for color change and rash.  Neurological:  Negative for dizziness, tremors, light-headedness and headaches.  Hematological:  Negative for adenopathy. Does not bruise/bleed easily.  Psychiatric/Behavioral:  Negative for dysphoric mood and sleep disturbance. The patient is not nervous/anxious.    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   L-S radiculopathy 05/27/2015    Allergies  Allergen Reactions   Cefuroxime Swelling   Clindamycin Other (See Comments) and Anaphylaxis   Erythromycin Rash   Penicillins Rash   Septra [Sulfamethoxazole-Trimethoprim] Diarrhea    Past Surgical History:  Procedure Laterality Date   BREAST BIOPSY Bilateral 2011   benign-needle bx   BREAST CYST ASPIRATION Right    negative 2011   BREAST CYST ASPIRATION Left    DILITATION & CURRETTAGE/HYSTROSCOPY WITH ESSURE     ECTOPIC PREGNANCY SURGERY     OVARIAN CYST REMOVAL     REFRACTIVE SURGERY      Social History   Tobacco Use   Smoking status: Former    Types: Cigarettes    Quit date: 11/20/1996    Years since quitting: 72.8  Smokeless tobacco: Former  Scientific laboratory technician Use: Never used  Substance Use Topics   Alcohol use: Yes    Alcohol/week: 0.0 standard drinks    Comment: occasional   Drug use: No     Medication list has been reviewed and updated.  Current Meds  Medication Sig   clonazePAM (KLONOPIN) 0.5 MG tablet Take 1 tablet by mouth as needed.   sertraline (ZOLOFT) 100 MG tablet Take 200 mg by mouth daily.   vitamin E 180 MG (400 UNITS) capsule Take 400 Units by mouth daily.   zolpidem (AMBIEN) 5 MG  tablet Take 5 mg by mouth at bedtime as needed.    PHQ 2/9 Scores 09/16/2021 06/07/2021 09/14/2020 03/04/2020  PHQ - 2 Score 0 0 0 0  PHQ- 9 Score 0 0 0 0    GAD 7 : Generalized Anxiety Score 09/16/2021 06/07/2021 09/14/2020 03/04/2020  Nervous, Anxious, on Edge 1 1 0 0  Control/stop worrying 0 1 0 0  Worry too much - different things 0 0 0 0  Trouble relaxing 0 0 0 0  Restless 0 0 0 0  Easily annoyed or irritable 0 0 0 0  Afraid - awful might happen 0 0 0 0  Total GAD 7 Score 1 2 0 0  Anxiety Difficulty Not difficult at all - Not difficult at all Not difficult at all    BP Readings from Last 3 Encounters:  09/16/21 128/86  06/07/21 118/82  10/06/20 104/73    Physical Exam Vitals and nursing note reviewed.  Constitutional:      General: She is not in acute distress.    Appearance: She is well-developed.  HENT:     Head: Normocephalic and atraumatic.     Right Ear: Tympanic membrane and ear canal normal.     Left Ear: Tympanic membrane and ear canal normal.     Nose:     Right Sinus: No maxillary sinus tenderness.     Left Sinus: No maxillary sinus tenderness.  Eyes:     General: No scleral icterus.       Right eye: No discharge.        Left eye: No discharge.     Conjunctiva/sclera: Conjunctivae normal.  Neck:     Thyroid: No thyromegaly.     Vascular: No carotid bruit.  Cardiovascular:     Rate and Rhythm: Normal rate and regular rhythm.     Pulses: Normal pulses.     Heart sounds: Normal heart sounds.  Pulmonary:     Effort: Pulmonary effort is normal. No respiratory distress.     Breath sounds: No wheezing.  Chest:  Breasts:    Right: No mass, nipple discharge, skin change or tenderness.     Left: No mass, nipple discharge, skin change or tenderness.       Comments: Smooth mobile non tender mass Abdominal:     General: Bowel sounds are normal.     Palpations: Abdomen is soft.     Tenderness: There is no abdominal tenderness.  Genitourinary:    Labia:         Right: No tenderness, lesion or injury.        Left: No tenderness, lesion or injury.      Vagina: Normal.     Cervix: Normal.     Uterus: Normal.      Adnexa: Right adnexa normal and left adnexa normal.  Musculoskeletal:     Cervical back: Normal range of motion. No  erythema.     Right lower leg: No edema.     Left lower leg: No edema.  Lymphadenopathy:     Cervical: No cervical adenopathy.  Skin:    General: Skin is warm and dry.     Findings: No rash.  Neurological:     Mental Status: She is alert and oriented to person, place, and time.     Cranial Nerves: No cranial nerve deficit.     Sensory: No sensory deficit.     Deep Tendon Reflexes: Reflexes are normal and symmetric.  Psychiatric:        Attention and Perception: Attention normal.        Mood and Affect: Mood normal.    Wt Readings from Last 3 Encounters:  09/16/21 210 lb (95.3 kg)  06/07/21 204 lb (92.5 kg)  10/06/20 210 lb (95.3 kg)    BP 128/86   Pulse 98   Ht 5' 8"  (1.727 m)   Wt 210 lb (95.3 kg)   SpO2 97%   BMI 31.93 kg/m   Assessment and Plan: 1. Annual physical exam Exam is normal except for weight. Encourage regular exercise and appropriate dietary changes. - CBC with Differential/Platelet - Comprehensive metabolic panel - TSH  2. Encounter for screening mammogram for breast cancer Follow up with regular mammogram in January unless the cystic area persists then will need Dx mammo.  She has long hx of recurrent cysts. - MM 3D SCREEN BREAST BILATERAL  3. Need for hepatitis C screening test - Hepatitis C antibody  4. Encounter for screening for cervical cancer Pap obtained today with HPV testing - Cytology - PAP  5. Mild hyperlipidemia Check labs and advise - Lipid panel  6. Colon cancer screening - Cologuard   Partially dictated using Editor, commissioning. Any errors are unintentional.  Halina Maidens, MD [facility]  09/16/2021

## 2021-09-17 LAB — COMPREHENSIVE METABOLIC PANEL
ALT: 14 IU/L (ref 0–32)
AST: 14 IU/L (ref 0–40)
Albumin/Globulin Ratio: 1.6 (ref 1.2–2.2)
Albumin: 4.5 g/dL (ref 3.8–4.8)
Alkaline Phosphatase: 89 IU/L (ref 44–121)
BUN/Creatinine Ratio: 15 (ref 9–23)
BUN: 13 mg/dL (ref 6–24)
Bilirubin Total: 0.3 mg/dL (ref 0.0–1.2)
CO2: 21 mmol/L (ref 20–29)
Calcium: 9.4 mg/dL (ref 8.7–10.2)
Chloride: 104 mmol/L (ref 96–106)
Creatinine, Ser: 0.89 mg/dL (ref 0.57–1.00)
Globulin, Total: 2.9 g/dL (ref 1.5–4.5)
Glucose: 113 mg/dL — ABNORMAL HIGH (ref 70–99)
Potassium: 4.8 mmol/L (ref 3.5–5.2)
Sodium: 143 mmol/L (ref 134–144)
Total Protein: 7.4 g/dL (ref 6.0–8.5)
eGFR: 79 mL/min/{1.73_m2} (ref >59)

## 2021-09-17 LAB — TSH: TSH: 1.76 u[IU]/mL (ref 0.450–4.500)

## 2021-09-17 LAB — CBC WITH DIFFERENTIAL/PLATELET
Basophils Absolute: 0 10*3/uL (ref 0.0–0.2)
Basos: 1 %
EOS (ABSOLUTE): 0.1 10*3/uL (ref 0.0–0.4)
Eos: 2 %
Hematocrit: 38.4 % (ref 34.0–46.6)
Hemoglobin: 12.7 g/dL (ref 11.1–15.9)
Immature Grans (Abs): 0 10*3/uL (ref 0.0–0.1)
Immature Granulocytes: 0 %
Lymphocytes Absolute: 1.6 10*3/uL (ref 0.7–3.1)
Lymphs: 24 %
MCH: 29.3 pg (ref 26.6–33.0)
MCHC: 33.1 g/dL (ref 31.5–35.7)
MCV: 89 fL (ref 79–97)
Monocytes Absolute: 0.6 10*3/uL (ref 0.1–0.9)
Monocytes: 8 %
Neutrophils Absolute: 4.3 10*3/uL (ref 1.4–7.0)
Neutrophils: 65 %
Platelets: 244 10*3/uL (ref 150–450)
RBC: 4.34 x10E6/uL (ref 3.77–5.28)
RDW: 12.5 % (ref 11.7–15.4)
WBC: 6.6 10*3/uL (ref 3.4–10.8)

## 2021-09-17 LAB — LIPID PANEL
Chol/HDL Ratio: 3.6 ratio (ref 0.0–4.4)
Cholesterol, Total: 247 mg/dL — ABNORMAL HIGH (ref 100–199)
HDL: 68 mg/dL (ref >39)
LDL Chol Calc (NIH): 162 mg/dL — ABNORMAL HIGH (ref 0–99)
Triglycerides: 96 mg/dL (ref 0–149)
VLDL Cholesterol Cal: 17 mg/dL (ref 5–40)

## 2021-09-17 LAB — HEPATITIS C ANTIBODY: Hep C Virus Ab: <0.1 s/co ratio (ref 0.0–0.9)

## 2021-09-19 ENCOUNTER — Ambulatory Visit: Payer: Self-pay | Admitting: *Deleted

## 2021-09-19 LAB — CYTOLOGY - PAP
Comment: NEGATIVE
Diagnosis: NEGATIVE
High risk HPV: NEGATIVE

## 2021-09-19 NOTE — Telephone Encounter (Signed)
Pt called in and was given the lab result message from Dr. Army Melia dated 09/18/2021 at 11:03 AM.  I let her know the A1C test was added to the blood  work at [facility] so we would contact her once that result came in. She was agreeable to this plan and did not have any questions.      Reason for Disposition . Health Information question, no triage required and triager able to answer question    Lab results given  Answer Assessment - Initial Assessment Questions 1. REASON FOR CALL: "What is the main reason for your call?     Lab results message from Dr  Army Melia 2. SYMPTOMS: "Does your child have any symptoms?"      N/A 3. OTHER QUESTIONS: "Do you have any other questions?"     No  - Author's note: IAQ's are intended for training purposes and not meant to be required on every  call.  Protocols used: Information Only Call - No Triage-P-AH

## 2021-09-21 LAB — HGB A1C W/O EAG: Hgb A1c MFr Bld: 5.9 % — ABNORMAL HIGH (ref 4.8–5.6)

## 2021-09-21 LAB — SPECIMEN STATUS REPORT

## 2021-11-28 LAB — COLOGUARD: COLOGUARD: NEGATIVE

## 2022-02-21 ENCOUNTER — Ambulatory Visit
Admission: RE | Admit: 2022-02-21 | Discharge: 2022-02-21 | Disposition: A | Discharge status: Home / Self Care | Payer: BC Managed Care – PPO | Source: Ambulatory Visit | Attending: Internal Medicine | Admitting: Internal Medicine

## 2022-02-21 DIAGNOSIS — Z1231 Encounter for screening mammogram for malignant neoplasm of breast: Secondary | ICD-10-CM | POA: Diagnosis present

## 2022-09-19 ENCOUNTER — Encounter: Payer: BC Managed Care – PPO | Admitting: Internal Medicine

## 2023-01-03 NOTE — Progress Notes (Unsigned)
Date:  01/04/2023   Name:  Lauren Cooke   DOB:  07/24/1971   MRN:  [id number]   Chief Complaint: No chief complaint on file. Lauren Cooke is a 52 y.o. female who presents today for her Complete Annual Exam. She feels {DESC; WELL/FAIRLY WELL/POORLY:18703}. She reports exercising ***. She reports she is sleeping {DESC; WELL/FAIRLY WELL/POORLY:18703}. Breast complaints ***.  Mammogram: 02/2022 DEXA: none Pap smear: 08/2021 neg/neg Colonoscopy: cologuard 11/2021  Health Maintenance Due  Topic Date Due   HIV Screening  Never done   DTaP/Tdap/Td (1 - Tdap) Never done   COLONOSCOPY (Pts 45-35yr Insurance coverage will need to be confirmed)  Never done   Zoster Vaccines- Shingrix (1 of 2) Never done   INFLUENZA VACCINE  06/20/2022   COVID-19 Vaccine (3 - 2023-24 season) 07/21/2022    Immunization History  Administered Date(s) Administered   Influenza,inj,Quad PF,6+ Mos 09/16/2021   Influenza-Unspecified 09/11/2019   PFIZER(Purple Top)SARS-COV-2 Vaccination 01/31/2020, 02/24/2020    HPI  Lab Results  Component Value Date   NA 143 09/16/2021   K 4.8 09/16/2021   CO2 21 09/16/2021   GLUCOSE 113 (H) 09/16/2021   BUN 13 09/16/2021   CREATININE 0.89 09/16/2021   CALCIUM 9.4 09/16/2021   EGFR 79 09/16/2021   GFRNONAA >60 10/06/2020   Lab Results  Component Value Date   CHOL 247 (H) 09/16/2021   HDL 68 09/16/2021   LDLCALC 162 (H) 09/16/2021   TRIG 96 09/16/2021   CHOLHDL 3.6 09/16/2021   Lab Results  Component Value Date   TSH 1.760 09/16/2021   Lab Results  Component Value Date   HGBA1C 5.9 (H) 09/16/2021   Lab Results  Component Value Date   WBC 6.6 09/16/2021   HGB 12.7 09/16/2021   HCT 38.4 09/16/2021   MCV 89 09/16/2021   PLT 244 09/16/2021   Lab Results  Component Value Date   ALT 14 09/16/2021   AST 14 09/16/2021   ALKPHOS 89 09/16/2021   BILITOT 0.3 09/16/2021   No results found for: "25OHVITD2", "25OHVITD3", "VD25OH"   Review of Systems   Constitutional:  Negative for chills, fatigue and fever.  HENT:  Negative for congestion, hearing loss, tinnitus, trouble swallowing and voice change.   Eyes:  Negative for visual disturbance.  Respiratory:  Negative for cough, chest tightness, shortness of breath and wheezing.   Cardiovascular:  Negative for chest pain, palpitations and leg swelling.  Gastrointestinal:  Negative for abdominal pain, constipation, diarrhea and vomiting.  Endocrine: Negative for polydipsia and polyuria.  Genitourinary:  Negative for dysuria, frequency, genital sores, vaginal bleeding and vaginal discharge.  Musculoskeletal:  Negative for arthralgias, gait problem and joint swelling.  Skin:  Negative for color change and rash.  Neurological:  Negative for dizziness, tremors, light-headedness and headaches.  Hematological:  Negative for adenopathy. Does not bruise/bleed easily.  Psychiatric/Behavioral:  Negative for dysphoric mood and sleep disturbance. The patient is not nervous/anxious.     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   L-S radiculopathy 05/27/2015    Allergies  Allergen Reactions   Cefuroxime Swelling   Clindamycin Other (See Comments) and Anaphylaxis   Erythromycin Rash   Penicillins Rash   Septra [Sulfamethoxazole-Trimethoprim] Diarrhea    Past Surgical History:  Procedure Laterality Date   BREAST  BIOPSY Bilateral 2011   benign-needle bx   BREAST CYST ASPIRATION Right    negative 2011   BREAST CYST ASPIRATION Left    DILITATION & CURRETTAGE/HYSTROSCOPY WITH ESSURE     ECTOPIC PREGNANCY SURGERY     OVARIAN CYST REMOVAL     REFRACTIVE SURGERY      Social History   Tobacco Use   Smoking status: Former    Types: Cigarettes    Quit date: 11/20/1996    Years since  quitting: 26.1   Smokeless tobacco: Former  Scientific laboratory technician Use: Never used  Substance Use Topics   Alcohol use: Yes    Alcohol/week: 0.0 standard drinks of alcohol    Comment: occasional   Drug use: No     Medication list has been reviewed and updated.  No outpatient medications have been marked as taking for the 01/04/23 encounter (Appointment) with Glean Hess, MD.       09/16/2021    8:17 AM 06/07/2021   11:34 AM 09/14/2020    9:34 AM 03/04/2020   10:29 AM  GAD 7 : Generalized Anxiety Score  Nervous, Anxious, on Edge 1 1 0 0  Control/stop worrying 0 1 0 0  Worry too much - different things 0 0 0 0  Trouble relaxing 0 0 0 0  Restless 0 0 0 0  Easily annoyed or irritable 0 0 0 0  Afraid - awful might happen 0 0 0 0  Total GAD 7 Score 1 2 0 0  Anxiety Difficulty Not difficult at all  Not difficult at all Not difficult at all       09/16/2021    8:17 AM 06/07/2021   11:33 AM 09/14/2020    9:34 AM  Depression screen PHQ 2/9  Decreased Interest 0 0 0  Down, Depressed, Hopeless 0 0 0  PHQ - 2 Score 0 0 0  Altered sleeping 0 0 0  Tired, decreased energy 0 0 0  Change in appetite 0 0 0  Feeling bad or failure about yourself  0 0 0  Trouble concentrating 0 0 0  Moving slowly or fidgety/restless 0 0 0  Suicidal thoughts 0 0 0  PHQ-9 Score 0 0 0  Difficult doing work/chores Not difficult at all Not difficult at all Not difficult at all    BP Readings from Last 3 Encounters:  09/16/21 128/86  06/07/21 118/82  10/06/20 104/73    Physical Exam Vitals and nursing note reviewed.  Constitutional:      General: She is not in acute distress.    Appearance: She is well-developed.  HENT:     Head: Normocephalic and atraumatic.     Right Ear: Tympanic membrane and ear canal normal.     Left Ear: Tympanic membrane and ear canal normal.     Nose:     Right Sinus: No maxillary sinus tenderness.     Left Sinus: No maxillary sinus tenderness.  Eyes:      General: No scleral icterus.       Right eye: No discharge.        Left eye: No discharge.     Conjunctiva/sclera: Conjunctivae normal.  Neck:     Thyroid: No thyromegaly.     Vascular: No carotid bruit.  Cardiovascular:     Rate and Rhythm: Normal rate and regular rhythm.     Pulses: Normal pulses.     Heart sounds: Normal heart sounds.  Pulmonary:  Effort: Pulmonary effort is normal. No respiratory distress.     Breath sounds: No wheezing.  Chest:  Breasts:    Right: No mass, nipple discharge, skin change or tenderness.     Left: No mass, nipple discharge, skin change or tenderness.  Abdominal:     General: Bowel sounds are normal.     Palpations: Abdomen is soft.     Tenderness: There is no abdominal tenderness.  Musculoskeletal:     Cervical back: Normal range of motion. No erythema.     Right lower leg: No edema.     Left lower leg: No edema.  Lymphadenopathy:     Cervical: No cervical adenopathy.  Skin:    General: Skin is warm and dry.     Findings: No rash.  Neurological:     Mental Status: She is alert and oriented to person, place, and time.     Cranial Nerves: No cranial nerve deficit.     Sensory: No sensory deficit.     Deep Tendon Reflexes: Reflexes are normal and symmetric.  Psychiatric:        Attention and Perception: Attention normal.        Mood and Affect: Mood normal.     Wt Readings from Last 3 Encounters:  09/16/21 210 lb (95.3 kg)  06/07/21 204 lb (92.5 kg)  10/06/20 210 lb (95.3 kg)    There were no vitals taken for this visit.  Assessment and Plan: Problem List Items Addressed This Visit       Endocrine   Enlarged thyroid gland     Other   Mild hyperlipidemia   Other Visit Diagnoses     Annual physical exam    -  Primary   Encounter for screening mammogram for breast cancer       Prediabetes            Partially dictated using Dragon software. Any errors are unintentional.  Halina Maidens, MD [facility]  01/03/2023

## 2023-01-04 ENCOUNTER — Ambulatory Visit (INDEPENDENT_AMBULATORY_CARE_PROVIDER_SITE_OTHER): Payer: BC Managed Care – PPO | Admitting: Internal Medicine

## 2023-01-04 ENCOUNTER — Encounter: Payer: Self-pay | Admitting: Internal Medicine

## 2023-01-04 VITALS — BP 128/78 | HR 98 | Ht 68.0 in | Wt 214.0 lb

## 2023-01-04 DIAGNOSIS — F411 Generalized anxiety disorder: Secondary | ICD-10-CM

## 2023-01-04 DIAGNOSIS — Z0000 Encounter for general adult medical examination without abnormal findings: Secondary | ICD-10-CM | POA: Diagnosis not present

## 2023-01-04 DIAGNOSIS — E785 Hyperlipidemia, unspecified: Secondary | ICD-10-CM

## 2023-01-04 DIAGNOSIS — R7303 Prediabetes: Secondary | ICD-10-CM

## 2023-01-04 DIAGNOSIS — Z1231 Encounter for screening mammogram for malignant neoplasm of breast: Secondary | ICD-10-CM | POA: Diagnosis not present

## 2023-01-04 DIAGNOSIS — E049 Nontoxic goiter, unspecified: Secondary | ICD-10-CM | POA: Diagnosis not present

## 2023-01-04 NOTE — Assessment & Plan Note (Signed)
Clinically stable on current regimen with good control of symptoms, No SI or HI. Continue follow up with Psych.

## 2023-01-04 NOTE — Patient Instructions (Signed)
Call [facility] to schedule your mammogram at [phone].  

## 2023-01-05 LAB — CBC WITH DIFFERENTIAL/PLATELET
Basophils Absolute: 0 10*3/uL (ref 0.0–0.2)
Basos: 1 %
EOS (ABSOLUTE): 0.1 10*3/uL (ref 0.0–0.4)
Eos: 2 %
Hematocrit: 38.2 % (ref 34.0–46.6)
Hemoglobin: 13 g/dL (ref 11.1–15.9)
Immature Grans (Abs): 0 10*3/uL (ref 0.0–0.1)
Immature Granulocytes: 0 %
Lymphocytes Absolute: 1.7 10*3/uL (ref 0.7–3.1)
Lymphs: 27 %
MCH: 30 pg (ref 26.6–33.0)
MCHC: 34 g/dL (ref 31.5–35.7)
MCV: 88 fL (ref 79–97)
Monocytes Absolute: 0.5 10*3/uL (ref 0.1–0.9)
Monocytes: 8 %
Neutrophils Absolute: 3.9 10*3/uL (ref 1.4–7.0)
Neutrophils: 62 %
Platelets: 234 10*3/uL (ref 150–450)
RBC: 4.34 x10E6/uL (ref 3.77–5.28)
RDW: 12.9 % (ref 11.7–15.4)
WBC: 6.3 10*3/uL (ref 3.4–10.8)

## 2023-01-05 LAB — COMPREHENSIVE METABOLIC PANEL
ALT: 28 IU/L (ref 0–32)
AST: 25 IU/L (ref 0–40)
Albumin/Globulin Ratio: 1.7 (ref 1.2–2.2)
Albumin: 4.5 g/dL (ref 3.8–4.9)
Alkaline Phosphatase: 88 IU/L (ref 44–121)
BUN/Creatinine Ratio: 15 (ref 9–23)
BUN: 11 mg/dL (ref 6–24)
Bilirubin Total: 0.4 mg/dL (ref 0.0–1.2)
CO2: 23 mmol/L (ref 20–29)
Calcium: 9.3 mg/dL (ref 8.7–10.2)
Chloride: 102 mmol/L (ref 96–106)
Creatinine, Ser: 0.74 mg/dL (ref 0.57–1.00)
Globulin, Total: 2.7 g/dL (ref 1.5–4.5)
Glucose: 112 mg/dL — ABNORMAL HIGH (ref 70–99)
Potassium: 4.4 mmol/L (ref 3.5–5.2)
Sodium: 141 mmol/L (ref 134–144)
Total Protein: 7.2 g/dL (ref 6.0–8.5)
eGFR: 98 mL/min/{1.73_m2} (ref >59)

## 2023-01-05 LAB — LIPID PANEL
Chol/HDL Ratio: 3.9 ratio (ref 0.0–4.4)
Cholesterol, Total: 270 mg/dL — ABNORMAL HIGH (ref 100–199)
HDL: 70 mg/dL (ref >39)
LDL Chol Calc (NIH): 181 mg/dL — ABNORMAL HIGH (ref 0–99)
Triglycerides: 110 mg/dL (ref 0–149)
VLDL Cholesterol Cal: 19 mg/dL (ref 5–40)

## 2023-01-05 LAB — HEMOGLOBIN A1C
Est. average glucose Bld gHb Est-mCnc: 126 mg/dL
Hgb A1c MFr Bld: 6 % — ABNORMAL HIGH (ref 4.8–5.6)

## 2023-01-05 LAB — TSH+FREE T4
Free T4: 1.03 ng/dL (ref 0.82–1.77)
TSH: 0.96 u[IU]/mL (ref 0.450–4.500)

## 2023-04-25 ENCOUNTER — Ambulatory Visit
Admission: RE | Admit: 2023-04-25 | Discharge: 2023-04-25 | Disposition: A | Discharge status: Home / Self Care | Payer: BC Managed Care – PPO | Source: Ambulatory Visit | Attending: Internal Medicine | Admitting: Internal Medicine

## 2023-04-25 DIAGNOSIS — Z1231 Encounter for screening mammogram for malignant neoplasm of breast: Secondary | ICD-10-CM | POA: Insufficient documentation

## 2023-12-08 ENCOUNTER — Telehealth: Payer: 59 | Admitting: Physician Assistant

## 2023-12-08 DIAGNOSIS — R6889 Other general symptoms and signs: Secondary | ICD-10-CM

## 2023-12-08 MED ORDER — BENZONATATE 100 MG PO CAPS: ORAL_CAPSULE | ORAL | 0 refills | Status: DC

## 2023-12-08 MED ORDER — FLUTICASONE PROPIONATE 50 MCG/ACT NA SUSP: 2.0000 | Freq: Every day | NASAL | 0 refills | Status: DC

## 2023-12-08 MED ORDER — OSELTAMIVIR PHOSPHATE 75 MG PO CAPS: 75.0000 mg | ORAL_CAPSULE | Freq: Two times a day (BID) | ORAL | 0 refills | Status: AC

## 2023-12-08 NOTE — Progress Notes (Signed)
Virtual Visit Consent   Lauren Cooke, you are scheduled for a virtual visit with a [facility] provider today. Just as with appointments in the office, your consent must be obtained to participate. Your consent will be active for this visit and any virtual visit you may have with one of our providers in the next 365 days. If you have a MyChart account, a copy of this consent can be sent to you electronically.  As this is a virtual visit, video technology does not allow for your provider to perform a traditional examination. This may limit your provider's ability to fully assess your condition. If your provider identifies any concerns that need to be evaluated in person or the need to arrange testing (such as labs, EKG, etc.), we will make arrangements to do so. Although advances in technology are sophisticated, we cannot ensure that it will always work on either your end or our end. If the connection with a video visit is poor, the visit may have to be switched to a telephone visit. With either a video or telephone visit, we are not always able to ensure that we have a secure connection.  By engaging in this virtual visit, you consent to the provision of healthcare and authorize for your insurance to be billed (if applicable) for the services provided during this visit. Depending on your insurance coverage, you may receive a charge related to this service.  I need to obtain your verbal consent now. Are you willing to proceed with your visit today? [name] has provided verbal consent on 12/08/2023 for a virtual visit (video or telephone). Roney Jaffe, PA-C  Date: 12/08/2023 5:59 PM  Virtual Visit via Video Note   I, Kindal Ponti S Mayers, connected with  Lauren Cooke  ([id number], 1970-12-22) on 12/08/23 at  5:45 PM EST by a video-enabled telemedicine application and verified that I am speaking with the correct person using two identifiers.  Location: Patient: Virtual Visit Location Patient:  Home Provider: Virtual Visit Location Provider: Home Office   I discussed the limitations of evaluation and management by telemedicine and the availability of in person appointments. The patient expressed understanding and agreed to proceed.    Discussed the use of AI scribe software for clinical note transcription with the patient, who gave verbal consent to proceed.    History of Present Illness: Lauren Cooke is a 53 y.o. who identifies as a female who was assigned female at birth, presented with symptoms suggestive of influenza. The onset of symptoms was on Thursday night with the patient reporting a high fever, reaching a maximum of 106 degrees Fahrenheit. The fever was accompanied by body aches, chills, and a cough. The patient also reported worsening congestion. Despite the cough, the patient noted minimal expectoration.  The patient's appetite was affected, with reduced food intake but maintained hydration through water and coffee. Over-the-counter medications, including Mucinex DM and Tylenol, were taken for symptom relief but were reported as ineffective.  The patient's household members, including her husband and daughter, were also unwell with similar symptoms. The patient had undergone a combined COVID and influenza A and B test the day before the consultation, which returned negative results. The patient had not received the influenza vaccine for the current season.   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   L-S radiculopathy 05/27/2015    Allergies:  Allergies  Allergen Reactions   Cefuroxime Swelling   Clindamycin Other (See Comments) and Anaphylaxis   Erythromycin Rash   Penicillins Rash   Septra [Sulfamethoxazole-Trimethoprim]  Diarrhea   Medications:  Current Outpatient Medications:    benzonatate (TESSALON) 100 MG capsule, Take 1-2 caps PO TID PRN, Disp: 20 capsule, Rfl: 0   fluticasone (FLONASE) 50 MCG/ACT nasal spray, Place 2 sprays into both nostrils daily., Disp: 16 g, Rfl: 0   oseltamivir (TAMIFLU) 75 MG capsule, Take 1 capsule (75 mg total) by mouth 2 (two) times daily for 5 days., Disp: 10 capsule, Rfl: 0   clonazePAM (KLONOPIN) 0.5 MG tablet, Take 1 tablet by mouth as needed., Disp: , Rfl:    MAGNESIUM PO, Take by mouth., Disp: , Rfl:    sertraline (ZOLOFT) 100 MG tablet, Take 200 mg by mouth daily., Disp: , Rfl:    vitamin E 180 MG (400 UNITS) capsule, Take 400 Units by mouth daily., Disp: , Rfl:    zolpidem (AMBIEN) 5 MG tablet, Take 5 mg by mouth at bedtime as needed., Disp: , Rfl:   Observations/Objective: Patient is well-developed, well-nourished in no acute distress.  Resting comfortably  at home.  Head is normocephalic, atraumatic.  No labored breathing.  Speech is clear and coherent with logical content.  Patient is alert and oriented at baseline.    Assessment and Plan: 1. Flu-like symptoms (Primary) Reasonable to treat for flu despite negative flu test trial Tamiflu, Tessalon Perles, Flonase.  Patient education given on supportive care, red flags given for prompt reevaluation. - fluticasone (FLONASE) 50 MCG/ACT nasal spray; Place 2 sprays into both nostrils daily.  Dispense: 16 g; Refill: 0 - oseltamivir (TAMIFLU) 75 MG capsule; Take 1 capsule (75 mg total) by mouth 2 (two) times daily for 5 days.  Dispense: 10 capsule; Refill: 0 - benzonatate (TESSALON) 100 MG capsule; Take 1-2 caps PO TID PRN  Dispense: 20 capsule; Refill: 0   Follow Up Instructions: I discussed the assessment and treatment plan with the patient. The patient was provided an opportunity to ask questions and all were answered. The patient agreed with the plan and demonstrated an understanding of the instructions.  A copy  of instructions were sent to the patient via MyChart unless otherwise noted below.   Patient has requested to receive PHI (AVS, Work Notes, etc) pertaining to this video visit through e-mail as they are currently without active MyChart. They have voiced understand that email is not considered secure and their health information could be viewed by someone other than the patient.   The patient was advised to call back or seek an in-person evaluation if the symptoms worsen or if the condition fails to improve as anticipated.    Kasandra Knudsen Mayers, PA-C

## 2023-12-08 NOTE — Patient Instructions (Signed)
[employer], thank you for joining Roney Jaffe, PA-C for today's virtual visit.  While this provider is not your primary care provider (PCP), if your PCP is located in our provider database this encounter information will be shared with them immediately following your visit.   A [facility] MyChart account gives you access to today's visit and all your visits, tests, and labs performed at [facility] " click here if you don't have a [facility] MyChart account or go to [email or web address]  Consent: (Patient) Burniece Dishon Charlet provided verbal consent for this virtual visit at the beginning of the encounter.  Current Medications:  Current Outpatient Medications:    benzonatate (TESSALON) 100 MG capsule, Take 1-2 caps PO TID PRN, Disp: 20 capsule, Rfl: 0   fluticasone (FLONASE) 50 MCG/ACT nasal spray, Place 2 sprays into both nostrils daily., Disp: 16 g, Rfl: 0   oseltamivir (TAMIFLU) 75 MG capsule, Take 1 capsule (75 mg total) by mouth 2 (two) times daily for 5 days., Disp: 10 capsule, Rfl: 0   clonazePAM (KLONOPIN) 0.5 MG tablet, Take 1 tablet by mouth as needed., Disp: , Rfl:    MAGNESIUM PO, Take by mouth., Disp: , Rfl:    sertraline (ZOLOFT) 100 MG tablet, Take 200 mg by mouth daily., Disp: , Rfl:    vitamin E 180 MG (400 UNITS) capsule, Take 400 Units by mouth daily., Disp: , Rfl:    zolpidem (AMBIEN) 5 MG tablet, Take 5 mg by mouth at bedtime as needed., Disp: , Rfl:    Medications ordered in this encounter:  Meds ordered this encounter  Medications   fluticasone (FLONASE) 50 MCG/ACT nasal spray    Sig: Place 2 sprays into both nostrils daily.    Dispense:  16 g    Refill:  0    Supervising Provider:   Merrilee Jansky [id number]   oseltamivir (TAMIFLU) 75 MG capsule    Sig: Take 1 capsule (75 mg total) by mouth 2 (two) times daily for 5 days.    Dispense:  10 capsule    Refill:  0    Supervising Provider:   Merrilee Jansky [[id number]]   benzonatate  (TESSALON) 100 MG capsule    Sig: Take 1-2 caps PO TID PRN    Dispense:  20 capsule    Refill:  0    Supervising Provider:   Merrilee Jansky [[id number]]     *If you need refills on other medications prior to your next appointment, please contact your pharmacy*  Follow-Up: Call back or seek an in-person evaluation if the symptoms worsen or if the condition fails to improve as anticipated.  [facility] [phone]  Other Instructions Influenza, Adult Influenza is also called the flu. It's an infection that affects your respiratory tract. This includes your nose, throat, windpipe, and lungs. The flu is contagious. This means it spreads easily from person to person. It causes symptoms that are like a cold. It can also cause a high fever and body aches. What are the causes? The flu is caused by the influenza virus. You can get it by: Breathing in droplets that are in the air after an infected person coughs or sneezes. Touching something that has the virus on it and then touching your mouth, nose, or eyes. What increases the risk? You may be more likely to get the flu if: You don't wash your hands often. You're near a lot of people during cold and flu season. You  touch your mouth, eyes, or nose without washing your hands first. You don't get a flu shot each year. You may also be more at risk for the flu and serious problems, such as a lung infection called pneumonia, if: You're older than 65. You're pregnant. Your immune system is weak. Your immune system is your body's defense system. You have a long-term, or chronic, condition, such as: Heart, kidney, or lung disease. Diabetes. A liver disorder. Asthma. You're very overweight. You have anemia. This is when you don't have enough red blood cells in your body. What are the signs or symptoms? Flu symptoms often start all of a sudden. They may last 4-14 days and include: Fever and chills. Headaches, body aches, or  muscle aches. Sore throat. Cough. Runny or stuffy nose. Discomfort in your chest. Not wanting to eat as much as normal. Feeling weak or tired. Feeling dizzy. Nausea or vomiting. How is this diagnosed? The flu may be diagnosed based on your symptoms and medical history. You may also have a physical exam. A swab may be taken from your nose or throat and tested for the virus. How is this treated? If the flu is found early, you can be treated with antiviral medicine. This may be given to you by mouth or through an IV. It can help you feel less sick and get better faster. Taking care of yourself at home can also help your symptoms get better. Your health care provider may tell you to: Take over-the-counter medicines. Drink lots of fluids. The flu often goes away on its own. If you have very bad symptoms or problems caused by the flu, you may need to be treated in a hospital. Follow these instructions at home: Activity Rest as needed. Get lots of sleep. Stay home from work or school as told by your provider. Leave home only to go see your provider. Do not leave home for other reasons until you don't have a fever for 24 hours without taking medicine. Eating and drinking Take an oral rehydration solution (ORS). This is a drink that is sold at pharmacies and stores. Drink enough fluid to keep your pee pale yellow. Try to drink small amounts of clear fluids. These include water, ice chips, fruit juice mixed with water, and low-calorie sports drinks. Try to eat bland foods that are easy to digest. These include bananas, applesauce, rice, lean meats, toast, and crackers. Avoid drinks that have a lot of sugar or caffeine in them. These include energy drinks, regular sports drinks, and soda. Do not drink alcohol. Do not eat spicy or fatty foods. General instructions     Take your medicines only as told by your provider. Use a cool mist humidifier to add moisture to the air in your home. This  can make it easier for you to breathe. You should also clean the humidifier every day. To do so: Empty the water. Pour clean water in. Cover your mouth and nose when you cough or sneeze. Wash your hands with soap and water often and for at least 20 seconds. It's extra important to do so after you cough or sneeze. If you can't use soap and water, use hand sanitizer. How is this prevented?  Get a flu shot every year. Ask your provider when you should get your flu shot. Stay away from people who are sick during fall and winter. Fall and winter are cold and flu season. Contact a health care provider if: You get new symptoms. You have chest  pain. You have watery poop, also called diarrhea. You have a fever. Your cough gets worse. You start to have more mucus. You feel like you may vomit, or you vomit. Get help right away if: You become short of breath or have trouble breathing. Your skin or nails turn blue. You have very bad pain or stiffness in your neck. You get a sudden headache or pain in your face or ear. You vomit each time you eat or drink. These symptoms may be an emergency. Call 911 right away. Do not wait to see if the symptoms will go away. Do not drive yourself to the hospital. This information is not intended to replace advice given to you by your health care provider. Make sure you discuss any questions you have with your health care provider. Document Revised: 08/09/2023 Document Reviewed: 12/14/2022 Elsevier Patient Education  2024 Elsevier Inc.   If you have been instructed to have an in-person evaluation today at a local Urgent Care facility, please use the link below. It will take you to a list of all of our available [facility], including address, phone number and hours of operation. Please do not delay care.  [facility]  If you or a family member do not have a primary care provider, use the link below to schedule a visit and establish  care. When you choose a [facility] primary care physician or advanced practice provider, you gain a long-term partner in health. Find a Primary Care Provider  Learn more about [facility]'s in-office and virtual care options:  - Get Care Now

## 2023-12-11 ENCOUNTER — Ambulatory Visit: Payer: Self-pay

## 2023-12-11 NOTE — Telephone Encounter (Signed)
Chief Complaint: Congestion  Symptoms: Congestion, cough, fatigue Frequency: comes and goes  Pertinent Negatives: Patient denies fever, worsening symptoms  Disposition: [] ED /[] Urgent Care (no appt availability in office) / [] Appointment(In office/virtual)/ []  [facility]/ [x] Home Care/ [] Refused Recommended Disposition /[] [facility]/ []  Follow-up with PCP Additional Notes: Patient states she was seen via virtual visit on 12/08/23 and was  treated for flu-like symptoms. Patient states she has been taking the prescribed medications as ordered since Sunday including Benzonatate, Fluticasone and Oseltamivir. Patient also reports taking Mucinex, tylenol and ibuprofen over the counter. Patient states she feels a little better but the congestion is the same. Patient reports mild wheezing along with the congestion. Care advice was given and patient advised to callback if symptoms do not not improve by Thursday or if symptoms get worse. Patient verbalized understanding.  Reason for Disposition  [1] Recent medical visit within 24 hours AND [2] condition / symptoms SAME (unchanged) AND [3] caller has additional questions triager can answer  Answer Assessment - Initial Assessment Questions 1. MAIN CONCERN OR SYMPTOM:  "What is your main concern right now?" "What question do you have?" "What's the main symptom you're worried about?" (e.g., breathing difficulty, cough, fever. pain)     Congestion  2. ONSET: "When did the  congestion  start?"     Last Thursday 1/16/5 3. BETTER-SAME-WORSE: "Are you getting better, staying the same, or getting worse compared to how you felt at your last visit to the doctor (most recent medical visit)?"     Better 4. VISIT DATE: "When were you seen?" (Date)     12/08/23 5. VISIT DOCTOR: "What is the name of the doctor taking care of you now?"     Cari, PA  6. VISIT DIAGNOSIS:  "What was the main symptom or problem that you were seen for?" "Were you given a  diagnosis?"      Possible Flu  7. VISIT MEDICINES: "Did the doctor order any new medicines for you to use?" If Yes, ask: "Have you filled the prescription and started taking the medicine?"      Oseltamivir, Benzonatate, Fluticasone yes started on Sunday  8. NEXT APPOINTMENT: "Have you scheduled a follow-up appointment with your doctor?"     No  9. PAIN: "Is there any pain?" If Yes, ask: "How bad is it?"  (Scale 0-10; or mild, moderate, severe)    - NONE (0): no pain    - MILD (1-3): doesn't interfere with normal activities     - MODERATE (4-7): interferes with normal activities or awakens from sleep     - SEVERE (8-10): excruciating pain, unable to do any normal activities     No  10. FEVER: "Do you have a fever?" If Yes, ask: "What is it, how was it measured  and when did it start?"       No 11. OTHER SYMPTOMS: "Do you have any other symptoms?"       Fatigue, cough, congestion,  Protocols used: Recent Medical Visit for Illness Follow-up Call-A-AH

## 2023-12-13 ENCOUNTER — Encounter: Payer: Self-pay | Admitting: Physician Assistant

## 2024-01-08 ENCOUNTER — Ambulatory Visit (INDEPENDENT_AMBULATORY_CARE_PROVIDER_SITE_OTHER): Payer: 59 | Admitting: Internal Medicine

## 2024-01-08 ENCOUNTER — Encounter: Payer: Self-pay | Admitting: Internal Medicine

## 2024-01-08 VITALS — BP 128/74 | HR 98 | Ht 68.0 in | Wt 215.8 lb

## 2024-01-08 DIAGNOSIS — Z0000 Encounter for general adult medical examination without abnormal findings: Secondary | ICD-10-CM

## 2024-01-08 DIAGNOSIS — R7303 Prediabetes: Secondary | ICD-10-CM | POA: Insufficient documentation

## 2024-01-08 DIAGNOSIS — Z1231 Encounter for screening mammogram for malignant neoplasm of breast: Secondary | ICD-10-CM

## 2024-01-08 DIAGNOSIS — Z6832 Body mass index (BMI) 32.0-32.9, adult: Secondary | ICD-10-CM | POA: Insufficient documentation

## 2024-01-08 DIAGNOSIS — E785 Hyperlipidemia, unspecified: Secondary | ICD-10-CM | POA: Diagnosis not present

## 2024-01-08 DIAGNOSIS — F411 Generalized anxiety disorder: Secondary | ICD-10-CM

## 2024-01-08 NOTE — Assessment & Plan Note (Signed)
 Followed and treated by psych. On Sertraline, clonazepam, Ambien

## 2024-01-08 NOTE — Assessment & Plan Note (Signed)
 Mild weight gain recently due to SSRI Recommend WW which has been effective in the past

## 2024-01-08 NOTE — Assessment & Plan Note (Signed)
Managed with diet alone 

## 2024-01-08 NOTE — Assessment & Plan Note (Signed)
 Managed with diet changes. Lab Results  Component Value Date   HGBA1C 6.0 (H) 01/04/2023

## 2024-01-08 NOTE — Patient Instructions (Signed)
 Call [facility] to schedule your mammogram at [phone].

## 2024-01-08 NOTE — Progress Notes (Signed)
 Date:  01/08/2024   Name:  Lauren Cooke   DOB:  [birth year]   MRN:  [id number]   Chief Complaint: Annual Exam Lauren Cooke is a 53 y.o. female who presents today for her Complete Annual Exam. She feels well. She reports exercising- none. She reports she is sleeping well. Breast complaints - none.  She is recovering from the Flu.  She has gained some weight and may start weight watchers.  Health Maintenance  Topic Date Due   HIV Screening  Never done   DTaP/Tdap/Td vaccine (1 - Tdap) Never done   COVID-19 Vaccine (3 - 2024-25 season) 07/22/2023   Flu Shot  02/18/2024*   Zoster (Shingles) Vaccine (1 of 2) 04/06/2024*   Mammogram  04/24/2024   Cologuard (Stool DNA test)  11/20/2024   Pap with HPV screening  09/16/2026   Hepatitis C Screening  Completed   HPV Vaccine  Aged Out  *Topic was postponed. The date shown is not the original due date.    HPI  Review of Systems  Constitutional:  Negative for chills, fatigue and fever.  HENT:  Negative for congestion, hearing loss, tinnitus, trouble swallowing and voice change.   Eyes:  Negative for visual disturbance.  Respiratory:  Negative for cough, chest tightness, shortness of breath and wheezing.   Cardiovascular:  Negative for chest pain, palpitations and leg swelling.  Gastrointestinal:  Negative for abdominal pain, constipation, diarrhea and vomiting.  Endocrine: Negative for polydipsia and polyuria.  Genitourinary:  Negative for dysuria, frequency, genital sores, vaginal bleeding and vaginal discharge.  Musculoskeletal:  Negative for arthralgias, gait problem and joint swelling.  Skin:  Negative for color change and rash.  Neurological:  Negative for dizziness, tremors, light-headedness and headaches.  Hematological:  Negative for adenopathy. Does not bruise/bleed easily.  Psychiatric/Behavioral:  Negative for dysphoric mood and sleep disturbance. The patient is not nervous/anxious.      Lab Results  Component Value  Date   NA 141 01/04/2023   K 4.4 01/04/2023   CO2 23 01/04/2023   GLUCOSE 112 (H) 01/04/2023   BUN 11 01/04/2023   CREATININE 0.74 01/04/2023   CALCIUM 9.3 01/04/2023   EGFR 98 01/04/2023   GFRNONAA >60 10/06/2020   Lab Results  Component Value Date   CHOL 270 (H) 01/04/2023   HDL 70 01/04/2023   LDLCALC 181 (H) 01/04/2023   TRIG 110 01/04/2023   CHOLHDL 3.9 01/04/2023   Lab Results  Component Value Date   TSH 0.960 01/04/2023   Lab Results  Component Value Date   HGBA1C 6.0 (H) 01/04/2023   Lab Results  Component Value Date   WBC 6.3 01/04/2023   HGB 13.0 01/04/2023   HCT 38.2 01/04/2023   MCV 88 01/04/2023   PLT 234 01/04/2023   Lab Results  Component Value Date   ALT 28 01/04/2023   AST 25 01/04/2023   ALKPHOS 88 01/04/2023   BILITOT 0.4 01/04/2023   No results found for: "25OHVITD2", "25OHVITD3", "VD25OH"   Patient Active Problem List   Diagnosis Date Noted   Prediabetes 01/08/2024   BMI 32.0-32.9,adult 01/08/2024   Mild hyperlipidemia 09/16/2021   Enlarged thyroid gland 03/09/2016   Chronic constipation 05/27/2015   Anxiety, generalized 05/27/2015   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   L-S radiculopathy 05/27/2015    Allergies  Allergen Reactions   Cefuroxime Swelling   Clindamycin Other (See Comments) and Anaphylaxis   Erythromycin Rash   Penicillins Rash  Septra [Sulfamethoxazole-Trimethoprim] Diarrhea    Past Surgical History:  Procedure Laterality Date   BREAST BIOPSY Bilateral 2011   benign-needle bx   BREAST CYST ASPIRATION Right    negative 2011   BREAST CYST ASPIRATION Left    DILITATION & CURRETTAGE/HYSTROSCOPY WITH ESSURE     ECTOPIC PREGNANCY SURGERY     OVARIAN CYST REMOVAL     REFRACTIVE SURGERY      Social History   Tobacco Use   Smoking status: Former    Current packs/day: 0.00    Types: Cigarettes    Quit date: 11/20/1996    Years since quitting: 27.1   Smokeless tobacco: Former   Building services engineer status: Never Used  Substance Use Topics   Alcohol use: Yes    Alcohol/week: 0.0 standard drinks of alcohol    Comment: occasional   Drug use: No     Medication list has been reviewed and updated.  Current Meds  Medication Sig   fluticasone (FLONASE) 50 MCG/ACT nasal spray Place 2 sprays into both nostrils daily.   MAGNESIUM PO Take by mouth.   sertraline (ZOLOFT) 100 MG tablet Take 200 mg by mouth daily.   vitamin E 180 MG (400 UNITS) capsule Take 400 Units by mouth daily.   zolpidem (AMBIEN) 5 MG tablet Take 5 mg by mouth at bedtime as needed.   [DISCONTINUED] clonazePAM (KLONOPIN) 0.5 MG tablet Take 1 tablet by mouth as needed.       01/04/2023   10:32 AM 09/16/2021    8:17 AM 06/07/2021   11:34 AM 09/14/2020    9:34 AM  GAD 7 : Generalized Anxiety Score  Nervous, Anxious, on Edge 1 1 1  0  Control/stop worrying 0 0 1 0  Worry too much - different things 0 0 0 0  Trouble relaxing 0 0 0 0  Restless 0 0 0 0  Easily annoyed or irritable 0 0 0 0  Afraid - awful might happen 0 0 0 0  Total GAD 7 Score 1 1 2  0  Anxiety Difficulty Not difficult at all Not difficult at all  Not difficult at all       01/04/2023   10:32 AM 09/16/2021    8:17 AM 06/07/2021   11:33 AM  Depression screen PHQ 2/9  Decreased Interest 0 0 0  Down, Depressed, Hopeless 0 0 0  PHQ - 2 Score 0 0 0  Altered sleeping 0 0 0  Tired, decreased energy 1 0 0  Change in appetite 0 0 0  Feeling bad or failure about yourself  0 0 0  Trouble concentrating 0 0 0  Moving slowly or fidgety/restless 0 0 0  Suicidal thoughts 0 0 0  PHQ-9 Score 1 0 0  Difficult doing work/chores Not difficult at all Not difficult at all Not difficult at all    BP Readings from Last 3 Encounters:  01/08/24 128/74  01/04/23 128/78  09/16/21 128/86    Physical Exam Vitals and nursing note reviewed.  Constitutional:      General: She is not in acute distress.    Appearance: She is well-developed.   HENT:     Head: Normocephalic and atraumatic.     Right Ear: Tympanic membrane and ear canal normal.     Left Ear: Tympanic membrane and ear canal normal.     Nose:     Right Sinus: No maxillary sinus tenderness.     Left Sinus: No maxillary sinus tenderness.  Eyes:  General: No scleral icterus.       Right eye: No discharge.        Left eye: No discharge.     Conjunctiva/sclera: Conjunctivae normal.  Neck:     Thyroid: No thyromegaly.     Vascular: No carotid bruit.  Cardiovascular:     Rate and Rhythm: Normal rate and regular rhythm.     Pulses: Normal pulses.     Heart sounds: Normal heart sounds.  Pulmonary:     Effort: Pulmonary effort is normal. No respiratory distress.     Breath sounds: No wheezing.  Abdominal:     General: Bowel sounds are normal.     Palpations: Abdomen is soft.     Tenderness: There is no abdominal tenderness.  Musculoskeletal:     Cervical back: Normal range of motion. No erythema.     Right lower leg: No edema.     Left lower leg: No edema.  Lymphadenopathy:     Cervical: No cervical adenopathy.  Skin:    General: Skin is warm and dry.     Findings: No rash.  Neurological:     Mental Status: She is alert and oriented to person, place, and time.     Cranial Nerves: No cranial nerve deficit.     Sensory: No sensory deficit.     Deep Tendon Reflexes: Reflexes are normal and symmetric.  Psychiatric:        Attention and Perception: Attention normal.        Mood and Affect: Mood normal.     Wt Readings from Last 3 Encounters:  01/08/24 215 lb 12.8 oz (97.9 kg)  01/04/23 214 lb (97.1 kg)  09/16/21 210 lb (95.3 kg)    BP 128/74   Pulse 98   Ht 5\' 8"  (1.727 m)   Wt 215 lb 12.8 oz (97.9 kg)   SpO2 94%   BMI 32.81 kg/m   Assessment and Plan:  Problem List Items Addressed This Visit       Unprioritized   Anxiety, generalized (Chronic)   Followed and treated by psych. On Sertraline, clonazepam, Ambien      Relevant Orders    TSH   Mild hyperlipidemia (Chronic)   Managed with diet alone.      Relevant Orders   Comprehensive metabolic panel   Lipid panel   Prediabetes   Managed with diet changes. Lab Results  Component Value Date   HGBA1C 6.0 (H) 01/04/2023         Relevant Orders   Hemoglobin A1c   BMI 32.0-32.9,adult   Mild weight gain recently due to SSRI Recommend WW which has been effective in the past      Other Visit Diagnoses       Annual physical exam    -  Primary   Relevant Orders   CBC with Differential/Platelet   Comprehensive metabolic panel   Hemoglobin A1c   Lipid panel   TSH     Encounter for screening mammogram for breast cancer       Relevant Orders   MM 3D SCREENING MAMMOGRAM BILATERAL BREAST       No follow-ups on file.    Reubin Milan, MD [facility] [place]

## 2024-01-09 LAB — COMPREHENSIVE METABOLIC PANEL
ALT: 16 [IU]/L (ref 0–32)
AST: 12 [IU]/L (ref 0–40)
Albumin: 4.4 g/dL (ref 3.8–4.9)
Alkaline Phosphatase: 98 [IU]/L (ref 44–121)
BUN/Creatinine Ratio: 20 (ref 9–23)
BUN: 16 mg/dL (ref 6–24)
Bilirubin Total: <0.2 mg/dL (ref 0.0–1.2)
CO2: 22 mmol/L (ref 20–29)
Calcium: 9.2 mg/dL (ref 8.7–10.2)
Chloride: 104 mmol/L (ref 96–106)
Creatinine, Ser: 0.8 mg/dL (ref 0.57–1.00)
Globulin, Total: 2.6 g/dL (ref 1.5–4.5)
Glucose: 107 mg/dL — ABNORMAL HIGH (ref 70–99)
Potassium: 4.4 mmol/L (ref 3.5–5.2)
Sodium: 141 mmol/L (ref 134–144)
Total Protein: 7 g/dL (ref 6.0–8.5)
eGFR: 89 mL/min/{1.73_m2} (ref >59)

## 2024-01-09 LAB — CBC WITH DIFFERENTIAL/PLATELET
Basophils Absolute: 0 10*3/uL (ref 0.0–0.2)
Basos: 1 %
EOS (ABSOLUTE): 0.1 10*3/uL (ref 0.0–0.4)
Eos: 2 %
Hematocrit: 37.6 % (ref 34.0–46.6)
Hemoglobin: 12.1 g/dL (ref 11.1–15.9)
Immature Grans (Abs): 0 10*3/uL (ref 0.0–0.1)
Immature Granulocytes: 0 %
Lymphocytes Absolute: 1.9 10*3/uL (ref 0.7–3.1)
Lymphs: 25 %
MCH: 28.9 pg (ref 26.6–33.0)
MCHC: 32.2 g/dL (ref 31.5–35.7)
MCV: 90 fL (ref 79–97)
Monocytes Absolute: 0.5 10*3/uL (ref 0.1–0.9)
Monocytes: 7 %
Neutrophils Absolute: 4.9 10*3/uL (ref 1.4–7.0)
Neutrophils: 65 %
Platelets: 282 10*3/uL (ref 150–450)
RBC: 4.19 x10E6/uL (ref 3.77–5.28)
RDW: 13.2 % (ref 11.7–15.4)
WBC: 7.5 10*3/uL (ref 3.4–10.8)

## 2024-01-09 LAB — TSH: TSH: 1.95 u[IU]/mL (ref 0.450–4.500)

## 2024-01-09 LAB — LIPID PANEL
Chol/HDL Ratio: 4 {ratio} (ref 0.0–4.4)
Cholesterol, Total: 247 mg/dL — ABNORMAL HIGH (ref 100–199)
HDL: 61 mg/dL (ref >39)
LDL Chol Calc (NIH): 174 mg/dL — ABNORMAL HIGH (ref 0–99)
Triglycerides: 74 mg/dL (ref 0–149)
VLDL Cholesterol Cal: 12 mg/dL (ref 5–40)

## 2024-01-09 LAB — HEMOGLOBIN A1C
Est. average glucose Bld gHb Est-mCnc: 128 mg/dL
Hgb A1c MFr Bld: 6.1 % — ABNORMAL HIGH (ref 4.8–5.6)

## 2024-01-10 ENCOUNTER — Encounter: Payer: Self-pay | Admitting: Internal Medicine

## 2024-01-16 ENCOUNTER — Ambulatory Visit: Payer: Self-pay | Admitting: Internal Medicine

## 2024-01-16 NOTE — Telephone Encounter (Signed)
 Copied from CRM [id number]. Topic: Clinical - Red Word Triage >> Jan 16, 2024  3:50 PM Hamdi H wrote: Red Word that prompted transfer to Nurse Triage: Knee pain since last week, severe pain and swelling in that knee. She would like a specific medication prescribed to help with the pain. She has been taking ibuprofen with no change to pain or swelling.   Chief Complaint: Knee pain  Symptoms: Left knee pain, knee swelling  Frequency: Constant  Pertinent Negatives: Patient denies any other complaint Disposition: [] ED /[] Urgent Care (no appt availability in office) / [x] Appointment(In office/virtual)/ []  [facility]/ [] Home Care/ [] Refused Recommended Disposition /[]  Mobile [facility]/ []  Follow-up with PCP Additional Notes: Patient reports that she has been experiencing left knee pain for the last 2 weeks. She states that her pain has been worsening and that there is now some minor swelling to her knee. She denies any other symptom. She wanted to know if her PCP would write her for Prednisone, stating she was seen last week for her physical. I advised that there was no guarantee that her PCP would write the prescription without an appointment. Patient understood and states she will go to the walk in orthopedic clinic today to be evaluated.      Reason for Disposition  [1] MODERATE pain (e.g., interferes with normal activities, limping) AND [2] present > 3 days  Answer Assessment - Initial Assessment Questions 1. LOCATION and RADIATION: "Where is the pain located?"      Left knee  2. QUALITY: "What does the pain feel like?"  (e.g., sharp, dull, aching, burning)     Aches 3. SEVERITY: "How bad is the pain?" "What does it keep you from doing?"   (Scale 1-10; or mild, moderate, severe)   -  MILD (1-3): doesn't interfere with normal activities    -  MODERATE (4-7): interferes with normal activities (e.g., work or school) or awakens from sleep, limping    -  SEVERE (8-10): excruciating  pain, unable to do any normal activities, unable to walk     7/10 4. ONSET: "When did the pain start?" "Does it come and go, or is it there all the time?"     2 weeks ago 5. RECURRENT: "Have you had this pain before?" If Yes, ask: "When, and what happened then?"     Yes, treated with Prednisone  6. SETTING: "Has there been any recent work, exercise or other activity that involved that part of the body?"      No 7. AGGRAVATING FACTORS: "What makes the knee pain worse?" (e.g., walking, climbing stairs, running)     Going down stairs, walking  8. ASSOCIATED SYMPTOMS: "Is there any swelling or redness of the knee?"     Swelling  9. OTHER SYMPTOMS: "Do you have any other symptoms?" (e.g., chest pain, difficulty breathing, fever, calf pain)     No 10. PREGNANCY: "Is there any chance you are pregnant?" "When was your last menstrual period?"       No  Protocols used: Knee Pain-A-AH

## 2024-04-28 ENCOUNTER — Ambulatory Visit: Payer: Self-pay

## 2024-04-28 NOTE — Telephone Encounter (Signed)
 Noted  Pt has a appt.  KP

## 2024-04-28 NOTE — Telephone Encounter (Signed)
 FYI Only or Action Required?: FYI only for provider  Patient was last seen in primary care on 01/08/2024 by Sheron Dixons, MD. Called Nurse Triage reporting Vaginal Bleeding. Symptoms began several weeks ago. Interventions attempted: Nothing. Symptoms are: unchanged.  Triage Disposition: See PCP Within 2 Weeks  Patient/caregiver understands and will follow disposition?: Yes    Copied from CRM [id number]. Topic: Clinical - Red Word Triage >> Apr 28, 2024 12:21 PM Ivette P wrote: Red Word that prompted transfer to Nurse Triage: been having period for 4 weeks. dont know if part of menopause.   Usually has a irregular period. Feel grumpy. Reason for Disposition  Periods last > 7 days  Answer Assessment - Initial Assessment Questions 1. AMOUNT: "Describe the bleeding that you are having."    - SPOTTING: spotting, or pinkish / brownish mucous discharge; does not fill panty liner or pad    - MILD:  less than 1 pad / hour; less than patient's usual menstrual bleeding   - MODERATE: 1-2 pads / hour; 1 menstrual cup every 6 hours; small-medium blood clots (e.g., pea, grape, small coin)   - SEVERE: soaking 2 or more pads/hour for 2 or more hours; 1 menstrual cup every 2 hours; bleeding not contained by pads or continuous red blood from vagina; large blood clots (e.g., golf ball, large coin)      Sometimes alternating between spotting, then heavy bleeding  2. ONSET: "When did the bleeding begin?" "Is it continuing now?"     4 weeks, current "heavy spotting"  3. MENSTRUAL PERIOD: "When was the last normal menstrual period?" "How is this different than your period?"     Last month, was the last "normal"  period  4. REGULARITY: "How regular are your periods?"     Not regular, had some normal cycles these last 5 months  5. ABDOMEN PAIN: "Do you have any pain?" "How bad is the pain?"  (e.g., Scale 1-10; mild, moderate, or severe)   - MILD (1-3): doesn't interfere with normal activities, abdomen soft  and not tender to touch    - MODERATE (4-7): interferes with normal activities or awakens from sleep, abdomen tender to touch    - SEVERE (8-10): excruciating pain, doubled over, unable to do any normal activities      Had some off/on cramps  6. PREGNANCY: "Is there any chance you are pregnant?" "When was your last menstrual period?"     No 7. BREASTFEEDING: "Are you breastfeeding?"     No  8. HORMONE MEDICINES: "Are you taking any hormone medicines, prescription or over-the-counter?" (e.g., birth control pills, estrogen)     No  9. BLOOD THINNER MEDICINES: "Do you take any blood thinners?" (e.g., Coumadin / warfarin, Pradaxa / dabigatran, aspirin)     No  10. CAUSE: "What do you think is causing the bleeding?" (e.g., recent gyn surgery, recent gyn procedure; known bleeding disorder, cervical cancer, polycystic ovarian disease, fibroids)         Unsure of cause  11. HEMODYNAMIC STATUS: "Are you weak or feeling lightheaded?" If Yes, ask: "Can you stand and walk normally?"        No  12. OTHER SYMPTOMS: "What other symptoms are you having with the bleeding?" (e.g., passed tissue, vaginal discharge, fever, menstrual-type cramps)       Feels "yucky" and grumps, moody, feels like constant PMS,  Protocols used: Vaginal Bleeding - Abnormal-A-AH

## 2024-04-29 ENCOUNTER — Encounter: Payer: Self-pay | Admitting: Internal Medicine

## 2024-04-29 ENCOUNTER — Ambulatory Visit (INDEPENDENT_AMBULATORY_CARE_PROVIDER_SITE_OTHER): Admitting: Internal Medicine

## 2024-04-29 VITALS — BP 134/84 | HR 101 | Ht 68.0 in | Wt 212.4 lb

## 2024-04-29 DIAGNOSIS — N924 Excessive bleeding in the premenopausal period: Secondary | ICD-10-CM | POA: Diagnosis not present

## 2024-04-29 NOTE — Assessment & Plan Note (Signed)
 Most likely perimenopause - will obtain FSH/LH and CBC Since bleeding is tapering off will monitor for now If worsening, consider Pelvic US  and/or GYN referral.

## 2024-04-29 NOTE — Progress Notes (Signed)
 Date:  04/29/2024   Name:  Lauren Cooke   DOB:  [birth year]   MRN:  [id number]   Chief Complaint: Vaginal Bleeding (Patient states her period has been on for a whole month, heavy at the beginning now is not as heavy)  Vaginal Bleeding The patient's pertinent negatives include no pelvic pain. Pertinent negatives include no chills, dysuria, fever or hematuria.  Normally spots a few day, then normal bleeding for 3-4 days then stops.  This past month spotted for a week then heavier bleeding for a week then the past 10 days lighter red blood. Small amount of cramping, no lightheadedness, no true hot flashes but she does have night sweats for the past year.  Review of Systems  Constitutional:  Positive for diaphoresis. Negative for chills, fatigue and fever.  Respiratory:  Negative for chest tightness and shortness of breath.   Cardiovascular:  Negative for chest pain, palpitations and leg swelling.  Genitourinary:  Positive for vaginal bleeding. Negative for dysuria, hematuria and pelvic pain.  Psychiatric/Behavioral:  Negative for dysphoric mood (unhappy with her job) and sleep disturbance. The patient is not nervous/anxious.      Lab Results  Component Value Date   NA 141 01/08/2024   K 4.4 01/08/2024   CO2 22 01/08/2024   GLUCOSE 107 (H) 01/08/2024   BUN 16 01/08/2024   CREATININE 0.80 01/08/2024   CALCIUM 9.2 01/08/2024   EGFR 89 01/08/2024   GFRNONAA >60 10/06/2020   Lab Results  Component Value Date   CHOL 247 (H) 01/08/2024   HDL 61 01/08/2024   LDLCALC 174 (H) 01/08/2024   TRIG 74 01/08/2024   CHOLHDL 4.0 01/08/2024   Lab Results  Component Value Date   TSH 1.950 01/08/2024   Lab Results  Component Value Date   HGBA1C 6.1 (H) 01/08/2024   Lab Results  Component Value Date   WBC 7.5 01/08/2024   HGB 12.1 01/08/2024   HCT 37.6 01/08/2024   MCV 90 01/08/2024   PLT 282 01/08/2024   Lab Results  Component Value Date   ALT 16 01/08/2024   AST 12 01/08/2024    ALKPHOS 98 01/08/2024   BILITOT <0.2 01/08/2024   No results found for: "25OHVITD2", "25OHVITD3", "VD25OH"   Patient Active Problem List   Diagnosis Date Noted   Excessive bleeding in premenopausal period 04/29/2024   Prediabetes 01/08/2024   BMI 32.0-32.9,adult 01/08/2024   Mild hyperlipidemia 09/16/2021   Enlarged thyroid gland 03/09/2016   Chronic constipation 05/27/2015   Anxiety, generalized 05/27/2015   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   L-S radiculopathy 05/27/2015    Allergies  Allergen Reactions   Cefuroxime Swelling   Clindamycin Other (See Comments) and Anaphylaxis   Erythromycin Rash   Penicillins Rash   Septra  [Sulfamethoxazole -Trimethoprim ] Diarrhea    Past Surgical History:  Procedure Laterality Date   BREAST BIOPSY Bilateral 2011   benign-needle bx   BREAST CYST ASPIRATION Right    negative 2011   BREAST CYST ASPIRATION Left    DILITATION & CURRETTAGE/HYSTROSCOPY WITH ESSURE     ECTOPIC PREGNANCY SURGERY     OVARIAN CYST REMOVAL     REFRACTIVE SURGERY      Social History   Tobacco Use   Smoking status: Former    Current packs/day: 0.00    Types: Cigarettes    Quit date: 11/20/1996    Years since quitting: 27.4   Smokeless tobacco: Former  Building services engineer status: Never  Used  Substance Use Topics   Alcohol use: Yes    Alcohol/week: 0.0 standard drinks of alcohol    Comment: occasional   Drug use: No     Medication list has been reviewed and updated.  Current Meds  Medication Sig   sertraline (ZOLOFT) 100 MG tablet Take 200 mg by mouth daily.       04/29/2024   11:53 AM 01/08/2024    9:36 AM 01/04/2023   10:32 AM 09/16/2021    8:17 AM  GAD 7 : Generalized Anxiety Score  Nervous, Anxious, on Edge 1 0 1 1  Control/stop worrying 0 0 0 0  Worry too much - different things 0 0 0 0  Trouble relaxing 0 0 0 0  Restless 0 0 0 0  Easily annoyed or irritable 0 0 0 0  Afraid - awful might happen 0 0 0 0   Total GAD 7 Score 1 0 1 1  Anxiety Difficulty Somewhat difficult Not difficult at all Not difficult at all Not difficult at all       04/29/2024   11:53 AM 01/08/2024    9:36 AM 01/04/2023   10:32 AM  Depression screen PHQ 2/9  Decreased Interest 0 0 0  Down, Depressed, Hopeless 1 0 0  PHQ - 2 Score 1 0 0  Altered sleeping 0 0 0  Tired, decreased energy 1 0 1  Change in appetite 0 0 0  Feeling bad or failure about yourself  0 0 0  Trouble concentrating 0 0 0  Moving slowly or fidgety/restless 0 0 0  Suicidal thoughts 0 0 0  PHQ-9 Score 2 0 1  Difficult doing work/chores Somewhat difficult Not difficult at all Not difficult at all    BP Readings from Last 3 Encounters:  04/29/24 134/84  01/08/24 128/74  01/04/23 128/78    Physical Exam Vitals and nursing note reviewed.  Constitutional:      General: She is not in acute distress.    Appearance: Normal appearance. She is well-developed.  HENT:     Head: Normocephalic and atraumatic.  Cardiovascular:     Rate and Rhythm: Normal rate and regular rhythm.  Pulmonary:     Effort: Pulmonary effort is normal. No respiratory distress.     Breath sounds: No wheezing or rhonchi.  Abdominal:     General: Abdomen is flat.     Palpations: Abdomen is soft.     Tenderness: There is no abdominal tenderness. There is no guarding or rebound.  Musculoskeletal:     Cervical back: Normal range of motion.     Right lower leg: No edema.     Left lower leg: No edema.  Lymphadenopathy:     Cervical: No cervical adenopathy.  Skin:    General: Skin is warm and dry.     Findings: No rash.  Neurological:     Mental Status: She is alert and oriented to person, place, and time.  Psychiatric:        Mood and Affect: Mood normal.        Behavior: Behavior normal.     Wt Readings from Last 3 Encounters:  04/29/24 212 lb 6 oz (96.3 kg)  01/08/24 215 lb 12.8 oz (97.9 kg)  01/04/23 214 lb (97.1 kg)    BP 134/84   Pulse (!) 101   Ht 5'  8" (1.727 m)   Wt 212 lb 6 oz (96.3 kg)   LMP 03/29/2024 (Approximate)   SpO2 98%  BMI 32.29 kg/m   Assessment and Plan:  Problem List Items Addressed This Visit       Unprioritized   Excessive bleeding in premenopausal period - Primary   Most likely perimenopause - will obtain FSH/LH and CBC Since bleeding is tapering off will monitor for now If worsening, consider Pelvic US  and/or GYN referral.      Relevant Orders   CBC with Differential/Platelet   FSH/LH    No follow-ups on file.    Sheron Dixons, MD [facility] [place]

## 2024-04-30 ENCOUNTER — Ambulatory Visit: Payer: Self-pay | Admitting: Internal Medicine

## 2024-04-30 LAB — CBC WITH DIFFERENTIAL/PLATELET
Basophils Absolute: 0 10*3/uL (ref 0.0–0.2)
Basos: 0 %
EOS (ABSOLUTE): 0.1 10*3/uL (ref 0.0–0.4)
Eos: 1 %
Hematocrit: 41.5 % (ref 34.0–46.6)
Hemoglobin: 13.3 g/dL (ref 11.1–15.9)
Immature Grans (Abs): 0 10*3/uL (ref 0.0–0.1)
Immature Granulocytes: 0 %
Lymphocytes Absolute: 2 10*3/uL (ref 0.7–3.1)
Lymphs: 26 %
MCH: 29.4 pg (ref 26.6–33.0)
MCHC: 32 g/dL (ref 31.5–35.7)
MCV: 92 fL (ref 79–97)
Monocytes Absolute: 0.6 10*3/uL (ref 0.1–0.9)
Monocytes: 8 %
Neutrophils Absolute: 4.9 10*3/uL (ref 1.4–7.0)
Neutrophils: 65 %
Platelets: 294 10*3/uL (ref 150–450)
RBC: 4.52 x10E6/uL (ref 3.77–5.28)
RDW: 13.3 % (ref 11.7–15.4)
WBC: 7.6 10*3/uL (ref 3.4–10.8)

## 2024-04-30 LAB — FSH/LH
FSH: 19.9 m[IU]/mL
LH: 15.3 m[IU]/mL

## 2024-06-27 ENCOUNTER — Ambulatory Visit: Admitting: Student

## 2024-06-27 ENCOUNTER — Encounter: Payer: Self-pay | Admitting: Student

## 2024-06-27 ENCOUNTER — Ambulatory Visit
Admission: RE | Admit: 2024-06-27 | Discharge: 2024-06-27 | Disposition: A | Discharge status: Home / Self Care | Source: Ambulatory Visit | Attending: Internal Medicine | Admitting: Internal Medicine

## 2024-06-27 VITALS — BP 120/84 | HR 92 | Ht 68.0 in | Wt 215.0 lb

## 2024-06-27 DIAGNOSIS — R399 Unspecified symptoms and signs involving the genitourinary system: Secondary | ICD-10-CM

## 2024-06-27 DIAGNOSIS — Z1231 Encounter for screening mammogram for malignant neoplasm of breast: Secondary | ICD-10-CM | POA: Diagnosis present

## 2024-06-27 LAB — POCT URINALYSIS DIPSTICK
Bilirubin, UA: NEGATIVE
Blood, UA: NEGATIVE
Glucose, UA: NEGATIVE
Ketones, UA: NEGATIVE
Leukocytes, UA: NEGATIVE
Nitrite, UA: NEGATIVE
Protein, UA: NEGATIVE
Spec Grav, UA: >=1.03 — AB (ref 1.010–1.025)
Urobilinogen, UA: 0.2 U/dL
pH, UA: 6 (ref 5.0–8.0)

## 2024-06-27 NOTE — Progress Notes (Signed)
 Established Patient Office Visit  Subjective   Patient ID: Lauren Cooke, female    DOB: 1971/10/02  Age: 53 y.o. MRN: [id number]  Chief Complaint  Patient presents with   Urinary Tract Infection    X 2 months, comes and goes, burning, discharge clear, foggy brain     Discussed the use of AI scribe software for clinical note transcription with the patient, who gave verbal consent to proceed.  History of Present Illness Lauren Cooke is a 53 year old female who presents with urinary symptoms including burning and urgency.  Urinary symptoms have persisted for two months, worsening in the last week. She experiences a burning sensation during and after urination and frequent urges to urinate with minimal output. There is no pelvic or abdominal pain, hematuria, back pain, fever, chills, or rash. She uses an antibacterial spray once or twice daily for relief without irritation.    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  Cardiovascular:     Rate and Rhythm: Normal rate and regular rhythm.  Pulmonary:     Effort: Pulmonary effort is normal.     Breath sounds: No rhonchi or rales.  Abdominal:     General: Abdomen is flat. Bowel sounds are normal. There is no distension.     Palpations: Abdomen is soft.     Tenderness:  There is no abdominal tenderness. There is no right CVA tenderness or left CVA tenderness.  Musculoskeletal:        General: Normal range of motion.     Right lower leg: No edema.     Left lower leg: No edema.  Skin:    General: Skin is warm and dry.     Capillary Refill: Capillary refill takes less than 2 seconds.  Neurological:     General: No focal deficit present.     Mental Status: She is alert and oriented to person, place, and time.  Psychiatric:        Mood and Affect: Mood normal.        Behavior: Behavior normal.        06/27/2024    8:13 AM 04/29/2024   11:53 AM 01/08/2024    9:36 AM  Depression screen PHQ 2/9  Decreased Interest 0 0 0  Down, Depressed, Hopeless 0 1 0  PHQ - 2 Score 0 1 0  Altered sleeping 0 0 0  Tired, decreased energy 2 1 0  Change in appetite 0 0 0  Feeling bad or failure about yourself  0 0 0  Trouble concentrating 2 0 0  Moving slowly or fidgety/restless 0 0 0  Suicidal thoughts 0 0 0  PHQ-9 Score 4 2 0  Difficult doing work/chores Not difficult at  all Somewhat difficult Not difficult at all       04/29/2024   11:53 AM 01/08/2024    9:36 AM 01/04/2023   10:32 AM 09/16/2021    8:17 AM  GAD 7 : Generalized Anxiety Score  Nervous, Anxious, on Edge 1 0 1 1  Control/stop worrying 0 0 0 0  Worry too much - different things 0 0 0 0  Trouble relaxing 0 0 0 0  Restless 0 0 0 0  Easily annoyed or irritable 0 0 0 0  Afraid - awful might happen 0 0 0 0  Total GAD 7 Score 1 0 1 1  Anxiety Difficulty Somewhat difficult Not difficult at all Not difficult at all Not difficult at all    No results found for any visits on 06/27/24.  Last CBC Lab Results  Component Value Date   WBC 7.6 04/29/2024   HGB 13.3 04/29/2024   HCT 41.5 04/29/2024   MCV 92 04/29/2024   MCH 29.4 04/29/2024   RDW 13.3 04/29/2024   PLT 294 04/29/2024   Last metabolic panel Lab Results  Component Value Date   GLUCOSE 107 (H) 01/08/2024   NA 141 01/08/2024   K 4.4  01/08/2024   CL 104 01/08/2024   CO2 22 01/08/2024   BUN 16 01/08/2024   CREATININE 0.80 01/08/2024   EGFR 89 01/08/2024   CALCIUM 9.2 01/08/2024   PROT 7.0 01/08/2024   ALBUMIN 4.4 01/08/2024   LABGLOB 2.6 01/08/2024   AGRATIO 1.7 01/04/2023   BILITOT <0.2 01/08/2024   ALKPHOS 98 01/08/2024   AST 12 01/08/2024   ALT 16 01/08/2024   ANIONGAP 9 10/06/2020   Last lipids Lab Results  Component Value Date   CHOL 247 (H) 01/08/2024   HDL 61 01/08/2024   LDLCALC 174 (H) 01/08/2024   TRIG 74 01/08/2024   CHOLHDL 4.0 01/08/2024   Last hemoglobin A1c Lab Results  Component Value Date   HGBA1C 6.1 (H) 01/08/2024      The 10-year ASCVD risk score (Arnett DK, et al., 2019) is: 1.5%    Assessment & Plan:  UTI symptoms Assessment & Plan Dysuria, normal POC urine dipstick.  - Avoid using irritants and sprays  - UA and culture sent - Prescribe antibiotics if culture positive. - Advised water based vaginal lubricants if have vaginal dryness - Encourage increased PO hydration    No follow-ups on file.    Harlene Saddler, MD

## 2024-06-27 NOTE — Progress Notes (Deleted)
 Date:  06/27/2024   Name:  Lauren Cooke   DOB:  1971-09-15   MRN:  [id number]   Chief Complaint: No chief complaint on file.  HPI    Medication list has been reviewed and updated.  No outpatient medications have been marked as taking for the 06/27/24 encounter (Appointment) with Lemon Raisin, MD.     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   L-S radiculopathy 05/27/2015    Allergies  Allergen Reactions   Cefuroxime Swelling   Clindamycin Other (See Comments) and Anaphylaxis   Erythromycin Rash   Penicillins Rash   Septra  [Sulfamethoxazole -Trimethoprim ] Diarrhea    Immunization History  Administered Date(s) Administered   Influenza,inj,Quad PF,6+ Mos 09/16/2021   Influenza-Unspecified 09/11/2019   PFIZER(Purple Top)SARS-COV-2 Vaccination 01/31/2020, 02/24/2020    Past Surgical History:  Procedure Laterality Date   BREAST BIOPSY Bilateral 2011   benign-needle bx   BREAST CYST ASPIRATION Right    negative 2011   BREAST CYST ASPIRATION Left    DILITATION & CURRETTAGE/HYSTROSCOPY WITH ESSURE     ECTOPIC PREGNANCY SURGERY     OVARIAN CYST REMOVAL     REFRACTIVE SURGERY      Social History   Tobacco Use   Smoking status: Former    Current packs/day: 0.00    Types: Cigarettes    Quit date: 11/20/1996    Years since quitting: 27.6   Smokeless tobacco: Former  Building services engineer status: Never Used  Substance Use Topics   Alcohol use: Yes    Alcohol/week: 0.0 standard drinks of alcohol    Comment: occasional   Drug use: No    Family History  Problem Relation Age of Onset   Coronary artery disease Father     Breast cancer Neg Hx         04/29/2024   11:53 AM 01/08/2024    9:36 AM 01/04/2023   10:32 AM 09/16/2021    8:17 AM  GAD 7 : Generalized Anxiety Score  Nervous, Anxious, on Edge 1 0 1 1  Control/stop worrying 0 0 0 0  Worry too much - different things 0 0 0 0  Trouble relaxing 0 0 0 0  Restless 0 0 0 0  Easily annoyed or irritable 0 0 0 0  Afraid - awful might happen 0 0 0 0  Total GAD 7 Score 1 0 1 1  Anxiety Difficulty Somewhat difficult Not difficult at all Not difficult at all Not difficult at all       04/29/2024   11:53 AM 01/08/2024    9:36 AM 01/04/2023   10:32 AM  Depression screen PHQ 2/9  Decreased Interest 0 0 0  Down, Depressed, Hopeless 1 0 0  PHQ - 2 Score 1 0 0  Altered sleeping 0 0 0  Tired, decreased energy 1 0 1  Change in appetite 0 0 0  Feeling bad or failure about yourself  0 0 0  Trouble concentrating 0 0 0  Moving slowly or fidgety/restless 0 0 0  Suicidal thoughts 0 0 0  PHQ-9 Score 2 0 1  Difficult doing work/chores Somewhat difficult Not difficult at all Not difficult  at all    BP Readings from Last 3 Encounters:  04/29/24 134/84  01/08/24 128/74  01/04/23 128/78    Wt Readings from Last 3 Encounters:  04/29/24 212 lb 6 oz (96.3 kg)  01/08/24 215 lb 12.8 oz (97.9 kg)  01/04/23 214 lb (97.1 kg)    There were no vitals taken for this visit.  Physical Exam  Recent Labs     Component Value Date/Time   NA 141 01/08/2024 0932   K 4.4 01/08/2024 0932   CL 104 01/08/2024 0932   CO2 22 01/08/2024 0932   GLUCOSE 107 (H) 01/08/2024 0932   GLUCOSE 116 (H) 10/06/2020 0455   BUN 16 01/08/2024 0932   CREATININE 0.80 01/08/2024 0932   CALCIUM 9.2 01/08/2024 0932   PROT 7.0 01/08/2024 0932   ALBUMIN 4.4 01/08/2024 0932   AST 12 01/08/2024 0932   ALT 16 01/08/2024 0932   ALKPHOS 98 01/08/2024 0932   BILITOT <0.2 01/08/2024 0932   GFRNONAA >60 10/06/2020 0455   GFRAA 121 09/14/2020 1009    Lab Results  Component Value Date   WBC  7.6 04/29/2024   HGB 13.3 04/29/2024   HCT 41.5 04/29/2024   MCV 92 04/29/2024   PLT 294 04/29/2024   Lab Results  Component Value Date   HGBA1C 6.1 (H) 01/08/2024   HGBA1C 6.0 (H) 01/04/2023   HGBA1C 5.9 (H) 09/16/2021   Lab Results  Component Value Date   CHOL 247 (H) 01/08/2024   HDL 61 01/08/2024   LDLCALC 174 (H) 01/08/2024   TRIG 74 01/08/2024   CHOLHDL 4.0 01/08/2024   Lab Results  Component Value Date   TSH 1.950 01/08/2024      Assessment and Plan:  There are no diagnoses linked to this encounter.   No follow-ups on file.    Rolan Hoyle, PA-C, DMSc, Nutritionist [facility] [phone]

## 2024-06-28 ENCOUNTER — Encounter: Payer: Self-pay | Admitting: Student

## 2024-06-28 LAB — URINALYSIS, ROUTINE W REFLEX MICROSCOPIC
Bilirubin, UA: NEGATIVE
Glucose, UA: NEGATIVE
Nitrite, UA: NEGATIVE
RBC, UA: NEGATIVE
Specific Gravity, UA: 1.028 (ref 1.005–1.030)
Urobilinogen, Ur: 0.2 mg/dL (ref 0.2–1.0)
pH, UA: 5.5 (ref 5.0–7.5)

## 2024-06-28 LAB — MICROSCOPIC EXAMINATION
Casts: NONE SEEN /LPF
WBC, UA: NONE SEEN /HPF (ref 0–5)

## 2024-06-30 ENCOUNTER — Other Ambulatory Visit: Payer: Self-pay | Admitting: Student

## 2024-06-30 MED ORDER — NITROFURANTOIN MONOHYD MACRO 100 MG PO CAPS
100.0000 mg | ORAL_CAPSULE | Freq: Two times a day (BID) | ORAL | 0 refills | Status: AC
Start: 2024-06-30 — End: 2024-07-05

## 2024-06-30 NOTE — Telephone Encounter (Signed)
 Please review patient's message:

## 2024-06-30 NOTE — Telephone Encounter (Signed)
 Please review patient's response.

## 2024-07-01 ENCOUNTER — Ambulatory Visit: Payer: Self-pay | Admitting: Student

## 2024-07-01 LAB — URINE CULTURE

## 2024-07-15 ENCOUNTER — Ambulatory Visit

## 2025-01-09 ENCOUNTER — Ambulatory Visit: Payer: 59 | Admitting: Student
